# Patient Record
Sex: Male | Born: 1955 | Hispanic: No | Marital: Married | Smoking: Former smoker
Health system: Southern US, Community
[De-identification: ages and names within clinical notes are randomized; demographics above are authoritative.]

## PROBLEM LIST (undated history)

## (undated) DIAGNOSIS — E785 Hyperlipidemia, unspecified: Secondary | ICD-10-CM

## (undated) DIAGNOSIS — S46009A Unspecified injury of muscle(s) and tendon(s) of the rotator cuff of unspecified shoulder, initial encounter: Secondary | ICD-10-CM

## (undated) HISTORY — DX: Hyperlipidemia, unspecified: E78.5

## (undated) HISTORY — PX: OTHER SURGICAL HISTORY: SHX169

## (undated) HISTORY — PX: ROTATOR CUFF REPAIR: SHX139

## (undated) HISTORY — DX: Unspecified injury of muscle(s) and tendon(s) of the rotator cuff of unspecified shoulder, initial encounter: S46.009A

---

## 2008-02-20 HISTORY — PX: SHOULDER SURGERY: SHX246

## 2011-05-08 ENCOUNTER — Ambulatory Visit (INDEPENDENT_AMBULATORY_CARE_PROVIDER_SITE_OTHER): Payer: BC Managed Care – PPO | Admitting: Family Medicine

## 2011-05-08 VITALS — BP 115/76 | HR 61 | Temp 98.1°F | Resp 14 | Ht 72.75 in | Wt 195.4 lb

## 2011-05-08 DIAGNOSIS — Z0000 Encounter for general adult medical examination without abnormal findings: Secondary | ICD-10-CM

## 2011-05-08 LAB — POCT CBC
Granulocyte percent: 74.1 % (ref 37–80)
HCT, POC: 46.4 % (ref 43.5–53.7)
Hemoglobin: 15.6 g/dL (ref 14.1–18.1)
Lymph, poc: 1.4 (ref 0.6–3.4)
MCH, POC: 30.8 pg (ref 27–31.2)
MCHC: 33.6 g/dL (ref 31.8–35.4)
MCV: 91.7 fL (ref 80–97)
MID (cbc): 0.4 (ref 0–0.9)
MPV: 8.1 fL (ref 0–99.8)
POC Granulocyte: 5 (ref 2–6.9)
POC LYMPH PERCENT: 20.6 % (ref 10–50)
POC MID %: 5.3 % (ref 0–12)
Platelet Count, POC: 237 10*3/uL (ref 142–424)
RBC: 5.06 M/uL (ref 4.69–6.13)
RDW, POC: 13 %
WBC: 6.5 10*3/uL (ref 4.6–10.2)

## 2011-05-08 LAB — COMPREHENSIVE METABOLIC PANEL
CO2: 26 mEq/L (ref 19–32)
Calcium: 9.3 mg/dL (ref 8.4–10.5)
Chloride: 106 mEq/L (ref 96–112)
Creat: 0.83 mg/dL (ref 0.50–1.35)
Glucose, Bld: 98 mg/dL (ref 70–99)
Total Bilirubin: 0.4 mg/dL (ref 0.3–1.2)
Total Protein: 7.3 g/dL (ref 6.0–8.3)

## 2011-05-08 LAB — COMPREHENSIVE METABOLIC PANEL WITH GFR
ALT: 26 U/L (ref 0–53)
AST: 18 U/L (ref 0–37)
Albumin: 4.4 g/dL (ref 3.5–5.2)
Alkaline Phosphatase: 47 U/L (ref 39–117)
BUN: 17 mg/dL (ref 6–23)
Potassium: 4.2 meq/L (ref 3.5–5.3)
Sodium: 141 meq/L (ref 135–145)

## 2011-05-08 LAB — LIPID PANEL
Cholesterol: 240 mg/dL — ABNORMAL HIGH (ref 0–200)
HDL: 44 mg/dL (ref >39)
LDL Cholesterol: 161 mg/dL — ABNORMAL HIGH (ref 0–99)
Total CHOL/HDL Ratio: 5.5 Ratio
Triglycerides: 176 mg/dL — ABNORMAL HIGH (ref <150)
VLDL: 35 mg/dL (ref 0–40)

## 2011-05-08 LAB — IFOBT (OCCULT BLOOD): IFOBT: NEGATIVE

## 2011-05-08 NOTE — Progress Notes (Signed)
  [facility]:  Office Visit  Chief Complaint:  Chief Complaint  Patient presents with  . Annual Exam    HPI: Anthony Wolf is a 56 y.o. male who complains of  No new problems. Here for PE. Doing well this year.  Past Medical History  Diagnosis Date  . Rotator cuff injury    Past Surgical History  Procedure Date  . Rotator cuff repair    History   Social History  . Marital Status: Married    Spouse Name: N/A    Number of Children: N/A  . Years of Education: N/A   Social History Main Topics  . Smoking status: Former Games developer  . Smokeless tobacco: None  . Alcohol Use: No  . Drug Use: No  . Sexually Active: None   Other Topics Concern  . None   Social History Narrative  . None   Family History  Problem Relation Age of Onset  . Cancer Mother   . Stroke Father   . Asthma Maternal Grandmother    No Known Allergies Prior to Admission medications   Not on File     ROS: The patient denies fevers, chills, night sweats, unintentional weight loss, chest pain, palpitations, wheezing, dyspnea on exertion, nausea, vomiting, abdominal pain, dysuria, hematuria, melena, numbness, weakness, or tingling.   All other systems have been reviewed and were otherwise negative with the exception of those mentioned in the HPI and as above.    PHYSICAL EXAM: Filed Vitals:   05/08/11 0856  BP: 115/76  Pulse: 61  Temp: 98.1 F (36.7 C)  Resp: 14   Filed Vitals:   05/08/11 0856  Height: 6' 0.75" (1.848 m)  Weight: 195 lb 6.4 oz (88.633 kg)   Body mass index is 25.96 kg/(m^2).  General: Alert, no acute distress HEENT:  Normocephalic, atraumatic, oropharynx patent. Tmnl, EOMI, PERRLA, fundoscopic exam nl Cardiovascular:  Regular rate and rhythm, no rubs murmurs or gallops.  No Carotid bruits, radial pulse intact. No pedal edema.  Respiratory: Clear to auscultation bilaterally.  No wheezes, rales, or rhonchi.  No cyanosis, no use of accessory musculature GI: No  organomegaly, abdomen is soft and non-tender, positive bowel sounds.  No masses. Skin: No rashes. Neurologic: Facial musculature symmetric. Psychiatric: Patient is appropriate throughout our interaction. Lymphatic: No cervical lymphadenopathy Musculoskeletal: Gait intact. GU: no hernias, testes nl, prostate normal. - hemosure   LABS:    EKG/XRAY:   Primary read interpreted by Dr. Conley Rolls at [facility].   ASSESSMENT/PLAN: Encounter Diagnosis  Name Primary?  . Annual physical exam Yes   Patient declines colonoscopy. Doing well overall F/u in 1 year   Joana Nolton PHUONG, DO 05/11/2011 9:09 AM

## 2011-06-16 ENCOUNTER — Ambulatory Visit: Payer: BC Managed Care – PPO | Admitting: Family Medicine

## 2011-06-16 DIAGNOSIS — J309 Allergic rhinitis, unspecified: Secondary | ICD-10-CM

## 2011-06-16 DIAGNOSIS — H1045 Other chronic allergic conjunctivitis: Secondary | ICD-10-CM

## 2011-06-16 DIAGNOSIS — H1010 Acute atopic conjunctivitis, unspecified eye: Secondary | ICD-10-CM

## 2011-06-16 MED ORDER — FLUTICASONE PROPIONATE 50 MCG/ACT NA SUSP: 2.0000 | Freq: Every day | NASAL | Status: DC

## 2011-06-16 MED ORDER — PREDNISONE 20 MG PO TABS: 20.0000 mg | ORAL_TABLET | Freq: Every day | ORAL | Status: AC

## 2011-06-16 MED ORDER — OLOPATADINE HCL 0.1 % OP SOLN: 1.0000 [drp] | Freq: Two times a day (BID) | OPHTHALMIC | Status: AC

## 2011-06-16 MED ORDER — CETIRIZINE HCL 10 MG PO TABS: 10.0000 mg | ORAL_TABLET | Freq: Every day | ORAL | Status: DC

## 2011-06-16 NOTE — Progress Notes (Signed)
  Subjective:    Patient ID: Anthony Wolf, male    DOB: 10/19/1955, 56 y.o.   MRN: [id number]  HPI  Patient presents with complaints of allergies worsening over the last 3-4 days   Review of Systems  Constitutional: Positive for fatigue.  HENT: Positive for congestion and sneezing.   Eyes: Positive for itching.  Respiratory: Negative for cough, chest tightness and wheezing.        Objective:   Physical Exam  Constitutional: He appears well-developed.  HENT:  Right Ear: Tympanic membrane is retracted.  Left Ear: Tympanic membrane is retracted.  Nose: Mucosal edema present.  Eyes:    Neck: Neck supple.  Cardiovascular: Normal rate, regular rhythm and normal heart sounds.   Pulmonary/Chest: Effort normal and breath sounds normal.  Neurological: He is alert.  Skin: Skin is warm.          Assessment & Plan:   1. Allergic rhinitis  fluticasone (FLONASE) 50 MCG/ACT nasal spray, cetirizine (ZYRTEC) 10 MG tablet, predniSONE (DELTASONE) 20 MG tablet  2. Conjunctivitis, allergic  Trial [place] if symptoms persist fill Pantanol Rx

## 2011-09-10 ENCOUNTER — Other Ambulatory Visit: Payer: Self-pay | Admitting: Family Medicine

## 2011-09-10 ENCOUNTER — Encounter: Payer: Self-pay | Admitting: Family Medicine

## 2011-09-10 DIAGNOSIS — E785 Hyperlipidemia, unspecified: Secondary | ICD-10-CM

## 2011-09-10 MED ORDER — SIMVASTATIN 40 MG PO TABS: 40.0000 mg | ORAL_TABLET | Freq: Every day | ORAL | Status: DC

## 2014-11-10 ENCOUNTER — Ambulatory Visit (INDEPENDENT_AMBULATORY_CARE_PROVIDER_SITE_OTHER): Payer: BLUE CROSS/BLUE SHIELD | Admitting: Family Medicine

## 2014-11-10 VITALS — BP 130/78 | HR 54 | Temp 97.8°F | Resp 16 | Ht 71.0 in | Wt 198.8 lb

## 2014-11-10 DIAGNOSIS — Z130 Encounter for screening for diseases of the blood and blood-forming organs and certain disorders involving the immune mechanism: Secondary | ICD-10-CM

## 2014-11-10 DIAGNOSIS — L989 Disorder of the skin and subcutaneous tissue, unspecified: Secondary | ICD-10-CM | POA: Diagnosis not present

## 2014-11-10 DIAGNOSIS — Z1329 Encounter for screening for other suspected endocrine disorder: Secondary | ICD-10-CM

## 2014-11-10 DIAGNOSIS — Z125 Encounter for screening for malignant neoplasm of prostate: Secondary | ICD-10-CM

## 2014-11-10 DIAGNOSIS — Z23 Encounter for immunization: Secondary | ICD-10-CM | POA: Diagnosis not present

## 2014-11-10 DIAGNOSIS — J302 Other seasonal allergic rhinitis: Secondary | ICD-10-CM | POA: Diagnosis not present

## 2014-11-10 DIAGNOSIS — Z0000 Encounter for general adult medical examination without abnormal findings: Secondary | ICD-10-CM | POA: Diagnosis not present

## 2014-11-10 DIAGNOSIS — Z13220 Encounter for screening for lipoid disorders: Secondary | ICD-10-CM

## 2014-11-10 LAB — CBC
HCT: 42.4 % (ref 39.0–52.0)
Hemoglobin: 14.8 g/dL (ref 13.0–17.0)
MCH: 30.3 pg (ref 26.0–34.0)
MCHC: 34.9 g/dL (ref 30.0–36.0)
MCV: 86.9 fL (ref 78.0–100.0)
MPV: 9.4 fL (ref 8.6–12.4)
Platelets: 220 10*3/uL (ref 150–400)
RBC: 4.88 MIL/uL (ref 4.22–5.81)
RDW: 13.2 % (ref 11.5–15.5)
WBC: 3.7 10*3/uL — ABNORMAL LOW (ref 4.0–10.5)

## 2014-11-10 LAB — LIPID PANEL
Cholesterol: 215 mg/dL — ABNORMAL HIGH (ref 125–200)
HDL: 44 mg/dL (ref >=40)
LDL Cholesterol: 141 mg/dL — ABNORMAL HIGH (ref <130)
Total CHOL/HDL Ratio: 4.9 Ratio (ref <=5.0)
Triglycerides: 149 mg/dL (ref <150)
VLDL: 30 mg/dL (ref <30)

## 2014-11-10 LAB — COMPLETE METABOLIC PANEL WITH GFR
ALT: 20 U/L (ref 9–46)
AST: 17 U/L (ref 10–35)
Albumin: 4.4 g/dL (ref 3.6–5.1)
Alkaline Phosphatase: 48 U/L (ref 40–115)
BUN: 14 mg/dL (ref 7–25)
Calcium: 9 mg/dL (ref 8.6–10.3)
Chloride: 103 mmol/L (ref 98–110)
Creat: 0.96 mg/dL (ref 0.70–1.33)
GFR, Est African American: >89 mL/min (ref >=60)
Total Bilirubin: 0.8 mg/dL (ref 0.2–1.2)
Total Protein: 7.4 g/dL (ref 6.1–8.1)

## 2014-11-10 LAB — TSH: TSH: 1.822 u[IU]/mL (ref 0.350–4.500)

## 2014-11-10 LAB — COMPLETE METABOLIC PANEL WITHOUT GFR
CO2: 27 mmol/L (ref 20–31)
GFR, Est Non African American: 87 mL/min (ref >=60)
Glucose, Bld: 89 mg/dL (ref 65–99)
Potassium: 4.1 mmol/L (ref 3.5–5.3)
Sodium: 137 mmol/L (ref 135–146)

## 2014-11-10 NOTE — Progress Notes (Signed)
Chief Complaint:  Chief Complaint  Patient presents with  . Annual Exam    HPI: Anthony Wolf is a 59 y.o. male who reports to [facility] today for an annual exam  He has a skin lesion on  The peripenile area. It has been there for the last 1year, not really noticed if he has had changes to this.  No skin cancer in the family Otherwise he is doing well.  Declines flu vaccine He is utd on colonscopy which he had 2 years ago, needs to come back in 10 years, done at [place].  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 He has 2 children, 1 in college the other 1 in med school.  He also has crack along the sides of his mouth, he eats a healthy diet. He denies canker sores or fever blisters.    Past Medical History  Diagnosis Date  . Rotator cuff injury    Past Surgical History  Procedure Laterality Date  . Rotator cuff repair     Social History   Social History  . Marital Status: Married    Spouse Name: N/A  . Number of Children: N/A  . Years of Education: N/A   Social History Main Topics  . Smoking status: Former Games developer  . Smokeless tobacco: None  . Alcohol Use: No  . Drug Use: No  . Sexual Activity: Not Asked   Other Topics Concern  . None   Social History Narrative   Family History  Problem Relation Age of Onset  . Cancer Mother   . Stroke Father   . Asthma Maternal Grandmother    No Known Allergies Prior to Admission medications   Not on File     ROS: The patient denies fevers, chills, night sweats, unintentional weight loss, chest pain, palpitations, wheezing, dyspnea on exertion, nausea, vomiting, abdominal pain, dysuria, hematuria, melena, numbness, weakness, or tingling.   All other systems have been reviewed and were otherwise negative with the exception of those mentioned in the HPI and as above.    PHYSICAL EXAM: Filed Vitals:   11/10/14 0815  BP: 130/78  Pulse: 54  Temp: 97.8 F (36.6 C)  Resp: 16   Body mass  index is 27.74 kg/(m^2).   General: Alert, no acute distress HEENT:  Normocephalic, atraumatic, oropharynx patent. EOMI, PERRLA, fundo exam normal, Tm normal  Cardiovascular:  Regular rate and rhythm, no rubs murmurs or gallops.  No Carotid bruits, radial pulse intact. No pedal edema.  Respiratory: Clear to auscultation bilaterally.  No wheezes, rales, or rhonchi.  No cyanosis, no use of accessory musculature Abdominal: No organomegaly, abdomen is soft and non-tender, positive bowel sounds. No masses. Skin: + hyperpigmented lesion  peripenis area Neurologic: Facial musculature symmetric. Psychiatric: Patient acts appropriately throughout our interaction. Lymphatic: No cervical or submandibular lymphadenopathy Musculoskeletal: Gait intact. No edema, tenderness Prostate and testicular and hernia exam normal   LABS: Results for orders placed or performed in visit on 05/08/11  Comprehensive metabolic panel  Result Value Ref Range   Sodium 141 135 - 145 mEq/L   Potassium 4.2 3.5 - 5.3 mEq/L   Chloride 106 96 - 112 mEq/L   CO2 26 19 - 32 mEq/L   Glucose, Bld 98 70 - 99 mg/dL   BUN 17 6 - 23 mg/dL   Creat 2.53 6.64 - 4.03 mg/dL   Total Bilirubin 0.4 0.3 - 1.2 mg/dL   Alkaline Phosphatase 47 39 -  117 U/L   AST 18 0 - 37 U/L   ALT 26 0 - 53 U/L   Total Protein 7.3 6.0 - 8.3 g/dL   Albumin 4.4 3.5 - 5.2 g/dL   Calcium 9.3 8.4 - 96.0 mg/dL  Lipid panel  Result Value Ref Range   Cholesterol 240 (H) 0 - 200 mg/dL   Triglycerides 454 (H) <150 mg/dL   HDL 44 >09 mg/dL   Total CHOL/HDL Ratio 5.5 Ratio   VLDL 35 0 - 40 mg/dL   LDL Cholesterol 811 (H) 0 - 99 mg/dL  POCT CBC  Result Value Ref Range   WBC 6.5 4.6 - 10.2 K/uL   Lymph, poc 1.4 0.6 - 3.4   POC LYMPH PERCENT 20.6 10 - 50 %L   MID (cbc) 0.4 0 - 0.9   POC MID % 5.3 0 - 12 %M   POC Granulocyte 5.0 2 - 6.9   Granulocyte percent 74.1 37 - 80 %G   RBC 5.06 4.69 - 6.13 M/uL   Hemoglobin 15.6 14.1 - 18.1 g/dL   HCT, POC 91.4  78.2 - 53.7 %   MCV 91.7 80 - 97 fL   MCH, POC 30.8 27 - 31.2 pg   MCHC 33.6 31.8 - 35.4 g/dL   RDW, POC 95.6 %   Platelet Count, POC 237 142 - 424 K/uL   MPV 8.1 0 - 99.8 fL  IFOBT POC (occult bld, rslt in office)  Result Value Ref Range   IFOBT Negative      EKG/XRAY:   Primary read interpreted by [facility] Con[facility] Rolls at [facility].   ASSESSMENT/PLAN: Encounter Diagnoses  Name Primary?  . Annual physical exam Yes  . Seasonal allergies   . Screening for deficiency anemia   . Screening for hyperlipidemia   . Screening for thyroid disorder   . Screening for prostate cancer   . Skin lesion    Annual labs pending Refer to dermatology Tetanus given Decline flu I have asked medical records to get colonoscopy report from [facility]   Gross sideeffects, risk and benefits, and alternatives of medications d/w patient. Patient is aware that all medications have potential sideeffects and we are unable to predict every sideeffect or drug-drug interaction that may occur.  Thao Le DO  11/10/2014 10:33 AM

## 2014-11-11 LAB — PSA: PSA: 2.11 ng/mL (ref <=4.00)

## 2014-11-15 ENCOUNTER — Telehealth: Payer: Self-pay

## 2014-11-15 NOTE — Telephone Encounter (Signed)
Patient is returning a missed phone call for lab results. Please call on home phone! [phone]

## 2014-11-15 NOTE — Telephone Encounter (Signed)
Labs not reviewed yet unless Dr. Conley Rolls called.

## 2014-11-29 ENCOUNTER — Encounter: Payer: Self-pay | Admitting: Family Medicine

## 2014-11-30 ENCOUNTER — Encounter: Payer: Self-pay | Admitting: Family Medicine

## 2015-11-19 ENCOUNTER — Ambulatory Visit (INDEPENDENT_AMBULATORY_CARE_PROVIDER_SITE_OTHER): Payer: BLUE CROSS/BLUE SHIELD | Admitting: Family Medicine

## 2015-11-19 VITALS — BP 116/76 | HR 60 | Temp 97.9°F | Resp 17 | Ht 71.0 in | Wt 193.0 lb

## 2015-11-19 DIAGNOSIS — Z0000 Encounter for general adult medical examination without abnormal findings: Secondary | ICD-10-CM | POA: Diagnosis not present

## 2015-11-19 DIAGNOSIS — E785 Hyperlipidemia, unspecified: Secondary | ICD-10-CM

## 2015-11-19 LAB — HEPATITIS C ANTIBODY: HCV Ab: REACTIVE — AB

## 2015-11-19 LAB — CBC WITH DIFFERENTIAL/PLATELET
BASOS PCT: 0 %
Basophils Absolute: 0 cells/uL (ref 0–200)
Eosinophils Absolute: 78 cells/uL (ref 15–500)
Eosinophils Relative: 2 %
HEMATOCRIT: 41.1 % (ref 38.5–50.0)
Hemoglobin: 14.5 g/dL (ref 13.2–17.1)
LYMPHS ABS: 1248 {cells}/uL (ref 850–3900)
LYMPHS PCT: 32 %
MCH: 31 pg (ref 27.0–33.0)
MCHC: 35.3 g/dL (ref 32.0–36.0)
MCV: 88 fL (ref 80.0–100.0)
MONO ABS: 312 {cells}/uL (ref 200–950)
MPV: 9.2 fL (ref 7.5–12.5)
Monocytes Relative: 8 %
NEUTROS ABS: 2262 {cells}/uL (ref 1500–7800)
Neutrophils Relative %: 58 %
PLATELETS: 216 10*3/uL (ref 140–400)
RBC: 4.67 MIL/uL (ref 4.20–5.80)
RDW: 12.9 % (ref 11.0–15.0)
WBC: 3.9 10*3/uL (ref 3.8–10.8)

## 2015-11-19 LAB — LIPID PANEL
Cholesterol: 214 mg/dL — ABNORMAL HIGH (ref 125–200)
HDL: 33 mg/dL — ABNORMAL LOW (ref >=40)
LDL Cholesterol: 115 mg/dL (ref <130)
Total CHOL/HDL Ratio: 6.5 Ratio — ABNORMAL HIGH (ref <=5.0)
Triglycerides: 332 mg/dL — ABNORMAL HIGH (ref <150)
VLDL: 66 mg/dL — ABNORMAL HIGH (ref <30)

## 2015-11-19 LAB — COMPREHENSIVE METABOLIC PANEL
ALT: 15 U/L (ref 9–46)
AST: 19 U/L (ref 10–35)
Albumin: 4.4 g/dL (ref 3.6–5.1)
Alkaline Phosphatase: 36 U/L — ABNORMAL LOW (ref 40–115)
BILIRUBIN TOTAL: 0.4 mg/dL (ref 0.2–1.2)
BUN: 18 mg/dL (ref 7–25)
CALCIUM: 9.2 mg/dL (ref 8.6–10.3)
CO2: 25 mmol/L (ref 20–31)
Chloride: 104 mmol/L (ref 98–110)
Creat: 0.95 mg/dL (ref 0.70–1.33)
GLUCOSE: 94 mg/dL (ref 65–99)
Potassium: 4.4 mmol/L (ref 3.5–5.3)
Sodium: 138 mmol/L (ref 135–146)
Total Protein: 7.3 g/dL (ref 6.1–8.1)

## 2015-11-19 LAB — TSH: TSH: 2.37 m[IU]/L (ref 0.40–4.50)

## 2015-11-19 LAB — PSA: PSA: 1.4 ng/mL (ref <=4.0)

## 2015-11-19 NOTE — Patient Instructions (Addendum)
Great to meet you!  Your cholesterol was elevated last year (it may be elevated this year).  Consider cholesterol medications. For now we can pursue improving your diet and continuing exercise but we should probably repeat your labs in 4-6 months to see if you are getting the results you want.    Fat and Cholesterol Restricted Diet High levels of fat and cholesterol in your blood may lead to various health problems, such as diseases of the heart, blood vessels, gallbladder, liver, and pancreas. Fats are concentrated sources of energy that come in various forms. Certain types of fat, including saturated fat, may be harmful in excess. Cholesterol is a substance needed by your body in small amounts. Your body makes all the cholesterol it needs. Excess cholesterol comes from the food you eat. When you have high levels of cholesterol and saturated fat in your blood, health problems can develop because the excess fat and cholesterol will gather along the walls of your blood vessels, causing them to narrow. Choosing the right foods will help you control your intake of fat and cholesterol. This will help keep the levels of these substances in your blood within normal limits and reduce your risk of disease. WHAT TYPES OF FAT SHOULD I CHOOSE?  Choose healthy fats more often. Choose monounsaturated and polyunsaturated fats, such as olive and canola oil, flaxseeds, walnuts, almonds, and seeds.  Eat more omega-3 fats. Good choices include salmon, mackerel, sardines, tuna, flaxseed oil, and ground flaxseeds. Aim to eat fish at least two times a week.  Limit saturated fats. Saturated fats are primarily found in animal products, such as meats, butter, and cream. Plant sources of saturated fats include palm oil, palm kernel oil, and coconut oil.  Avoid foods with partially hydrogenated oils in them. These contain trans fats. Examples of foods that contain trans fats are stick margarine, some tub margarines, cookies,  crackers, and other baked goods. WHAT GENERAL GUIDELINES DO I NEED TO FOLLOW? These guidelines for healthy eating will help you control your intake of fat and cholesterol:  Check food labels carefully to identify foods with trans fats or high amounts of saturated fat.  Fill one half of your plate with vegetables and green salads.  Fill one fourth of your plate with whole grains. Look for the word "whole" as the first word in the ingredient list.  Fill one fourth of your plate with lean protein foods.  Limit fruit to two servings a day. Choose fruit instead of juice.  Eat more foods that contain soluble fiber. Examples of foods that contain this type of fiber are apples, broccoli, carrots, beans, peas, and barley. Aim to get 20-30 g of fiber per day.  Eat more home-cooked food and less restaurant, buffet, and fast food.  Limit or avoid alcohol.  Limit foods high in starch and sugar.  Limit fried foods.  Cook foods using methods other than frying. Baking, boiling, grilling, and broiling are all great options.  Lose weight if you are overweight. Losing just 5-10% of your initial body weight can help your overall health and prevent diseases such as diabetes and heart disease. WHAT FOODS CAN I EAT? Grains Whole grains, such as whole wheat or whole grain breads, crackers, cereals, and pasta. Unsweetened oatmeal, bulgur, barley, quinoa, or brown rice. Corn or whole wheat flour tortillas. Vegetables Fresh or frozen vegetables (raw, steamed, roasted, or grilled). Green salads. Fruits All fresh, canned (in natural juice), or frozen fruits. Meat and Other Protein Products Ground beef (  85% or leaner), grass-fed beef, or beef trimmed of fat. Skinless chicken or [place]. Ground chicken or [place]. Pork trimmed of fat. All fish and seafood. Eggs. Dried beans, peas, or lentils. Unsalted nuts or seeds. Unsalted canned or dry beans. Dairy Low-fat dairy products, such as skim or 1% milk, 2% or  reduced-fat cheeses, low-fat ricotta or cottage cheese, or plain low-fat yogurt. Fats and Oils Tub margarines without trans fats. Light or reduced-fat mayonnaise and salad dressings. Avocado. Olive, canola, sesame, or safflower oils. Natural peanut or almond butter (choose ones without added sugar and oil). The items listed above may not be a complete list of recommended foods or beverages. Contact your dietitian for more options. WHAT FOODS ARE NOT RECOMMENDED? Grains White bread. White pasta. White rice. Cornbread. Bagels, pastries, and croissants. Crackers that contain trans fat. Vegetables White potatoes. Corn. Creamed or fried vegetables. Vegetables in a cheese sauce. Fruits Dried fruits. Canned fruit in light or heavy syrup. Fruit juice. Meat and Other Protein Products Fatty cuts of meat. Ribs, chicken wings, bacon, sausage, bologna, salami, chitterlings, fatback, hot dogs, bratwurst, and packaged luncheon meats. Liver and organ meats. Dairy Whole or 2% milk, cream, half-and-half, and cream cheese. Whole milk cheeses. Whole-fat or sweetened yogurt. Full-fat cheeses. Nondairy creamers and whipped toppings. Processed cheese, cheese spreads, or cheese curds. Sweets and Desserts Corn syrup, sugars, honey, and molasses. Candy. Jam and jelly. Syrup. Sweetened cereals. Cookies, pies, cakes, donuts, muffins, and ice cream. Fats and Oils Butter, stick margarine, lard, shortening, ghee, or bacon fat. Coconut, palm kernel, or palm oils. Beverages Alcohol. Sweetened drinks (such as sodas, lemonade, and fruit drinks or punches). The items listed above may not be a complete list of foods and beverages to avoid. Contact your dietitian for more information.   This information is not intended to replace advice given to you by your health care provider. Make sure you discuss any questions you have with your health care provider.   Document Released: 02/05/2005 Document Revised: 02/26/2014 Document  Reviewed: 05/06/2013 Elsevier Interactive Patient Education 2016 [employer]     IF you received an x-ray today, you will receive an invoice from [facility]. Please contact [facility] at [phone] with questions or concerns regarding your invoice.   IF you received labwork today, you will receive an invoice from [facility]. Please contact [facility] at [phone] with questions or concerns regarding your invoice.   Our billing staff will not be able to assist you with questions regarding bills from these companies.  You will be contacted with the lab results as soon as they are available. The fastest way to get your results is to activate your My Chart account. Instructions are located on the last page of this paperwork. If you have not heard from us regarding the results in 2 weeks, please contact this office.

## 2015-11-19 NOTE — Progress Notes (Addendum)
   HPI  Patient presents today here for an annual physical exam.  Patient has no complaints or reports feeling like he is in very good health.  He exercises 2-3 times a week with walking or road biking. He does not limit his diet except for portion control. He would like lab work done today, he's fasting.  He declines a flu shot. He is up-to-date on colonoscopy  We discussed his elevated cholesterol, he would like to recheck labs, and try therapeutic lifestyle changes prior to trying medications if they are needed.  PMH: Smoking status noted Family history positive for stroke in his father, cancer in mother Denies tobacco, alcohol, and drug use. Has past medical history of allergies He is married and sexually active with only his wife. ROS: Per HPI  Objective: BP 116/76 (BP Location: Left Arm, Patient Position: Sitting, Cuff Size: Large)   Pulse 60   Temp 97.9 F (36.6 C) (Oral)   Resp 17   Ht 5\' 11"  (1.803 m)   Wt 193 lb (87.5 kg)   SpO2 99%   BMI 26.92 kg/m  Gen: NAD, alert, cooperative with exam HEENT: NCAT, EOMI, PERRL, nares clear, TMs normal bilaterally, oropharynx clear Neck: Supple, no tender lymphadenopathy CV: RRR, good S1/S2, no murmur Resp: CTABL, no wheezes, non-labored Abd: SNTND, BS present, no guarding or organomegaly Ext: No edema, warm Neuro: Alert and oriented, strength 5/5 and sensation intact in bilateral lower extremities  He declines prostate exam   Assessment and plan:  # Annual physical exam Normal exam Basic labs, fasting Hepatitis C screening also Offered influenza vaccine, he declines  # Hyperlipidemia 60 year old ASCVD risk score based on last years measurements are 7.9%/5.2% Discussed with patient, he would like to defer treatment if this is still elevated to give a trial of therapeutic lifestyle changes which were discussed.  His wife takes Crestor, if he needs a prescription that would likely be what he prefers.    Orders  Placed This Encounter  Procedures  . CBC with Differential  . Comprehensive metabolic panel  . Lipid panel  . PSA(Must document that pt has been informed of limitations of PSA testing.)  . TSH  . Hepatitis C antibody     Murtis SinkSam Bradshaw, MD  11/19/2015, 8:51 AM

## 2015-11-22 LAB — HEPATITIS C RNA QUANTITATIVE: HCV Quantitative: NOT DETECTED IU/mL (ref <15)

## 2015-11-23 ENCOUNTER — Encounter: Payer: Self-pay | Admitting: Family Medicine

## 2015-11-23 ENCOUNTER — Telehealth: Payer: Self-pay | Admitting: Family Medicine

## 2015-11-23 NOTE — Telephone Encounter (Signed)
No answer, Left VM that we will send letter, please call with any questions.   9.7/5.2% 10 year ASCVD risk.   Murtis SinkSam Bradshaw, MD 11/23/2015, 1:45 PM

## 2016-01-10 ENCOUNTER — Ambulatory Visit (INDEPENDENT_AMBULATORY_CARE_PROVIDER_SITE_OTHER): Payer: BLUE CROSS/BLUE SHIELD | Admitting: Family Medicine

## 2016-01-10 ENCOUNTER — Ambulatory Visit (INDEPENDENT_AMBULATORY_CARE_PROVIDER_SITE_OTHER): Payer: BLUE CROSS/BLUE SHIELD

## 2016-01-10 ENCOUNTER — Other Ambulatory Visit: Payer: Self-pay

## 2016-01-10 VITALS — BP 134/86 | HR 69 | Temp 98.1°F | Resp 16 | Ht 71.0 in | Wt 193.8 lb

## 2016-01-10 DIAGNOSIS — S93402A Sprain of unspecified ligament of left ankle, initial encounter: Secondary | ICD-10-CM

## 2016-01-10 MED ORDER — IBUPROFEN 800 MG PO TABS: 800.0000 mg | ORAL_TABLET | Freq: Three times a day (TID) | ORAL | 1 refills | Status: DC | PRN

## 2016-01-10 NOTE — Patient Instructions (Addendum)
IF you received an x-ray today, you will receive an invoice from [facility]. Please contact [facility] at [phone] with questions or concerns regarding your invoice.   IF you received labwork today, you will receive an invoice from [facility]. Please contact [facility] at [phone] with questions or concerns regarding your invoice.   Our billing staff will not be able to assist you with questions regarding bills from these companies.  You will be contacted with the lab results as soon as they are available. The fastest way to get your results is to activate your My Chart account. Instructions are located on the last page of this paperwork. If you have not heard from us regarding the results in 2 weeks, please contact this office.      Ankle Sprain, Phase II Rehab Ask your health care provider which exercises are safe for you. Do exercises exactly as told by your health care provider and adjust them as directed. It is normal to feel mild stretching, pulling, tightness, or discomfort as you do these exercises, but you should stop right away if you feel sudden pain or your pain gets worse.Do not begin these exercises until told by your health care provider. Stretching and range of motion exercises These exercises warm up your muscles and joints and improve the movement and flexibility of your lower leg and ankle. These exercises also help to relieve pain and stiffness. Exercise A: Gastroc stretch, standing 1. Stand with your hands against a wall. 2. Extend your left / right leg behind you, and bend your front knee slightly. Your heels should be on the floor. 3. Keeping your heels on the floor and your back knee straight, shift your weight toward the wall. You should feel a gentle stretch in the back of your lower leg (calf). 4. Hold this position for __________ seconds. Repeat __________ times. Complete this exercise __________ times a  day. Exercise B: Soleus stretch, standing 1. Stand with your hands against a wall. 2. Extend your left / right leg behind you, and bend your front knee slightly. Both of your heels should be on the floor. 3. Keeping your heels on the floor, bend your back knee and shift your weight slightly over your back leg. You should feel a gentle stretch deep in your calf. 4. Hold this position for __________ seconds. Repeat __________ times. Complete this exercise __________ times a day. Strengthening exercises These exercises build strength and endurance in your lower leg. Endurance is the ability to use your muscles for a long time, even after they get tired. Exercise C: Heel walking (dorsiflexion) Walk on your heels for __________ seconds or ___________ ft. Keep your toes as high as possible. Repeat __________ times. Complete this exercise __________ times a day. Balance exercises These exercises improve your balance and the reaction and control of your ankle to help improve stability. Exercise D: Multi-angle lunge 1. Stand with your feet together. 2. Take a step forward with your left / right leg, and shift your weight onto that leg. Your back heel will come off the floor, and your back toes will stay in place. 3. Push off your front leg to return your front foot to the starting position next to your other foot. 4. Repeat to the side, to the back, and any other directions as told by your health care provider. Repeat in each direction __________ times. Complete this exercise __________ times a day. Exercise E: Single leg stand 1. Without  shoes, stand near a railing or in a door frame. Hold onto the railing or door frame as needed. 2. Stand on your left / right foot. Keep your big toe down on the floor and try to keep your arch lifted. 3. Hold this position for __________ seconds. Repeat __________ times. Complete this exercise __________ times a day. If this exercise is too easy, you can try it with  your eyes closed or while standing on a pillow. Exercise F: Inversion/eversion You will need a balance board for this exercise. Ask your health care provider where you can get a balance board or how you can make one. 1. Stand on a non-carpeted surface near a countertop or wall. 2. Step onto the balance board so your feet are hip-width apart. 3. Keep your feet in place and keep your upper body and hips steady. Using only your feet and ankles to move the board, do one or both of the following exercises as told by your health care provider:  Tip the board side to side as far as you can, alternating between tipping to the left and tipping to the right. If you can, tip the board so it silently taps the floor. Do not let the board forcefully hit the floor. From time to time, pause to hold a steady position.  Tip the board side to side so the board does not hit the floor at all. From time to time, pause to hold a steady position. Repeat the movement for each exercise __________ times. Complete each exercise __________ times a day. Exercise G: Plantar flexion/dorsiflexion You will need a balance board for this exercise. Ask your health care provider where you can get a balance board or how you can make one. 1. Stand on a non-carpeted surface near a countertop or wall. 2. Step onto the balance board so your feet are hip-width apart. 3. Keep your feet in place and keep your upper body and hips steady. Using only your feet and ankles to move the board, do one or both of the following exercises as told by your health care provider:  Tip the board forward and backward so the board silently taps the floor. Do not let the board forcefully hit the floor. From time to time, pause to hold a steady position.  Tip the board forward and backward so the board does not hit the floor at all. From time to time, pause to hold a steady position. Repeat the movement for each exercise __________ times. Complete each exercise  __________ times a day. This information is not intended to replace advice given to you by your health care provider. Make sure you discuss any questions you have with your health care provider. Document Released: 05/28/2005 Document Revised: 10/13/2015 Document Reviewed: 12/20/2014 Elsevier Interactive Patient Education  2017 [employer]

## 2016-01-10 NOTE — Progress Notes (Signed)
  Chief Complaint  Patient presents with  . Ankle Pain    Injured today    HPI   Left Ankle Injury  Pt reports that he was working in his backyard and stepped into a ditch and twisted his ankle  He started to limp right away.  He reports that he immediately had swelling over the bone of the ankle on the outside of the foot (lateral malleolus) He reports that his pain is 5/10.   Past Medical History:  Diagnosis Date  . Rotator cuff injury     No current outpatient prescriptions on file.   No current facility-administered medications for this visit.     Allergies: No Known Allergies  Past Surgical History:  Procedure Laterality Date  . colonscopy     08/11/2012-internal hemorrhoids, repeat in 2024  . ROTATOR CUFF REPAIR      Social History   Social History  . Marital status: Married    Spouse name: N/A  . Number of children: N/A  . Years of education: N/A   Social History Main Topics  . Smoking status: Former Games developermoker  . Smokeless tobacco: None  . Alcohol use No  . Drug use: No  . Sexual activity: Not Asked   Other Topics Concern  . None   Social History Narrative  . None    Review of Systems  Constitutional: Negative for chills, fever and weight loss.  Eyes: Negative for blurred vision and double vision.  Musculoskeletal: Negative for back pain, falls and myalgias.  Neurological: Negative for dizziness and tingling.    Objective: Vitals:   01/10/16 1125  BP: 134/86  Pulse: 69  Resp: 16  Temp: 98.1 F (36.7 C)  TempSrc: Oral  SpO2: 97%  Weight: 193 lb 12.8 oz (87.9 kg)  Height: 5\' 11"  (1.803 m)    Physical Exam  Constitutional: He is oriented to person, place, and time. He appears well-developed and well-nourished.  HENT:  Head: Normocephalic and atraumatic.  Eyes: Conjunctivae and EOM are normal.  Pulmonary/Chest: Effort normal.  Musculoskeletal:       Left foot: There is decreased range of motion, tenderness, bony tenderness and  swelling. There is normal capillary refill, no crepitus, no deformity and no laceration.       Feet:  Neurological: He is alert and oriented to person, place, and time.  Skin: Capillary refill takes less than 2 seconds.  Psychiatric: He has a normal mood and affect. His behavior is normal. Judgment and thought content normal.    CLINICAL DATA:  Sprained left ankle.  EXAM: LEFT ANKLE COMPLETE - 3+ VIEW  COMPARISON:  None  FINDINGS: Moderate lateral soft tissue swelling identified. There is no underlying fracture or subluxation. No radio-opaque foreign bodies.  IMPRESSION: 1. Lateral soft tissue swelling compatible with the clinical history of sprain. 2. No acute bony abnormality identified.   Electronically Signed   By: Signa Kellaylor  Stroud M.D.   On: 01/10/2016 12:30     Assessment and Plan Devynn was seen today for ankle pain.  Diagnoses and all orders for this visit:  Sprain of left ankle, unspecified ligament, initial encounter -     DG Ankle Complete Left -  Will get xray to evaluate for fracture based on [place] ankle rules No fracture Therefore will treat for ankle sprain -  Discussed ice, elevation, and NSAIDs -  Will provide supportive care and brace   Lewayne Pauley A Creta LevinStallings

## 2016-11-23 ENCOUNTER — Encounter: Payer: Self-pay | Admitting: Urgent Care

## 2016-11-23 ENCOUNTER — Ambulatory Visit (INDEPENDENT_AMBULATORY_CARE_PROVIDER_SITE_OTHER): Payer: BLUE CROSS/BLUE SHIELD | Admitting: Urgent Care

## 2016-11-23 VITALS — BP 116/72 | HR 67 | Temp 98.6°F | Resp 17 | Ht 71.5 in | Wt 191.0 lb

## 2016-11-23 DIAGNOSIS — Z0000 Encounter for general adult medical examination without abnormal findings: Secondary | ICD-10-CM | POA: Diagnosis not present

## 2016-11-23 DIAGNOSIS — Z114 Encounter for screening for human immunodeficiency virus [HIV]: Secondary | ICD-10-CM

## 2016-11-23 DIAGNOSIS — N529 Male erectile dysfunction, unspecified: Secondary | ICD-10-CM

## 2016-11-23 MED ORDER — SILDENAFIL CITRATE 20 MG PO TABS: 20.0000 mg | ORAL_TABLET | Freq: Every day | ORAL | 0 refills | Status: DC | PRN

## 2016-11-23 NOTE — Progress Notes (Signed)
MRN: [id number]  Subjective:   [name] is a 61 y.o. male presenting for annual physical exam and erectile dysfucntion. Patient is married, does not have children. Works as an Personnel officer. Has good relationships at home, has a good support network. Denies smoking cigarettes or drinking alcohol.   Medical care team includes: PCP: None. Vision: Gets yearly eye exams. Wears eye glasses. Dental: Dental cleanings every 6 months. Has dentures.  Specialists: None.   ED - Reports that he has had several month history of difficulty obtaining and maintaining erections. He would like to try Viagra. Denies dysuria, hematuria, urinary frequency, penile discharge, penile swelling, testicular pain, testicular swelling, anal pain, groin pain. Denies history of HTN, HL, heart disease.  Anthony Wolf is not currently taking any medications and has No Known Allergies. Anthony Wolf  has a past medical history of Rotator cuff injury. Also  has a past surgical history that includes Rotator cuff repair and colonscopy. His family history includes Asthma in his maternal grandmother; Cancer in his mother; Stroke in his father.  Immunizations: Patient refuses immunizations.  Review of Systems  Constitutional: Negative for chills, diaphoresis, fever, malaise/fatigue and weight loss.  HENT: Negative for congestion, ear discharge, ear pain, hearing loss, nosebleeds, sore throat and tinnitus.   Eyes: Negative for blurred vision, double vision, photophobia, pain, discharge and redness.  Respiratory: Negative for cough, shortness of breath and wheezing.   Cardiovascular: Negative for chest pain, palpitations and leg swelling.  Gastrointestinal: Negative for abdominal pain, blood in stool, constipation, diarrhea, nausea and vomiting.  Genitourinary: Negative for dysuria, flank pain, frequency, hematuria and urgency.  Musculoskeletal: Negative for back pain, joint pain and myalgias.  Skin: Negative for itching and rash.    Neurological: Negative for dizziness, tingling, seizures, loss of consciousness, weakness and headaches.  Endo/Heme/Allergies: Negative for polydipsia.  Psychiatric/Behavioral: Negative for depression, hallucinations, memory loss, substance abuse and suicidal ideas. The patient is not nervous/anxious and does not have insomnia.     Objective:   Vitals: BP 116/72   Pulse 67   Temp 98.6 F (37 C) (Oral)   Resp 17   Ht 5' 11.5" (1.816 m)   Wt 191 lb (86.6 kg)   SpO2 98%   BMI 26.27 kg/m   Physical Exam  Constitutional: He is oriented to person, place, and time. He appears well-developed and well-nourished.  HENT:  TM's intact bilaterally, no effusions or erythema. Nasal turbinates pink and moist, nasal passages patent. No sinus tenderness. Oropharynx clear, mucous membranes moist, dentition in good repair.  Eyes: Pupils are equal, round, and reactive to light. Conjunctivae and EOM are normal. Right eye exhibits no discharge. Left eye exhibits no discharge. No scleral icterus.  Neck: Normal range of motion. Neck supple. No thyromegaly present.  Cardiovascular: Normal rate, regular rhythm and intact distal pulses.  Exam reveals no gallop and no friction rub.   No murmur heard. Pulmonary/Chest: No stridor. No respiratory distress. He has no wheezes. He has no rales.  Abdominal: Soft. Bowel sounds are normal. He exhibits no distension and no mass. There is no tenderness.  Musculoskeletal: Normal range of motion. He exhibits no edema or tenderness.  Lymphadenopathy:    He has no cervical adenopathy.  Neurological: He is alert and oriented to person, place, and time. He has normal reflexes. He displays normal reflexes. Coordination normal.  Skin: Skin is warm and dry. No rash noted. No erythema. No pallor.  Psychiatric: He has a normal mood and affect.  Assessment and Plan :   1. Annual physical exam - Medically healthy and pleasant patient. Discussed healthy lifestyle, diet,  exercise, preventative care, vaccinations, and addressed patient's concerns.  - TSH - Lipid panel - Comprehensive metabolic panel - CBC  2. Screening for HIV (human immunodeficiency virus) - HIV antibody  3. Erectile dysfunction, unspecified erectile dysfunction type - Will start trial of sildenafil. Counseled patient on potential for adverse effects with medications prescribed today, patient verbalized understanding. Follow up in ~2 weeks. - sildenafil (REVATIO) 20 MG tablet; Take 1-2 tablets (20-40 mg total) by mouth daily as needed.  Dispense: 30 tablet; Refill: 0   Wallis Bamberg, PA-C [facility] [phone] 11/23/2016  8:31 AM

## 2016-11-23 NOTE — Patient Instructions (Addendum)
Health Maintenance, Male A healthy lifestyle and preventive care is important for your health and wellness. Ask your health care provider about what schedule of regular examinations is right for you. What should I know about weight and diet? Eat a Healthy Diet  Eat plenty of vegetables, fruits, whole grains, low-fat dairy products, and lean protein.  Do not eat a lot of foods high in solid fats, added sugars, or salt.  Maintain a Healthy Weight Regular exercise can help you achieve or maintain a healthy weight. You should:  Do at least 150 minutes of exercise each week. The exercise should increase your heart rate and make you sweat (moderate-intensity exercise).  Do strength-training exercises at least twice a week.  Watch Your Levels of Cholesterol and Blood Lipids  Have your blood tested for lipids and cholesterol every 5 years starting at 61 years of age. If you are at high risk for heart disease, you should start having your blood tested when you are 61 years old. You may need to have your cholesterol levels checked more often if: ? Your lipid or cholesterol levels are high. ? You are older than 61 years of age. ? You are at high risk for heart disease.  What should I know about cancer screening? Many types of cancers can be detected early and may often be prevented. Lung Cancer  You should be screened every year for lung cancer if: ? You are a current smoker who has smoked for at least 30 years. ? You are a former smoker who has quit within the past 15 years.  Talk to your health care provider about your screening options, when you should start screening, and how often you should be screened.  Colorectal Cancer  Routine colorectal cancer screening usually begins at 61 years of age and should be repeated every 5-10 years until you are 61 years old. You may need to be screened more often if early forms of precancerous polyps or small growths are found. Your health care provider  may recommend screening at an earlier age if you have risk factors for colon cancer.  Your health care provider may recommend using home test kits to check for hidden blood in the stool.  A small camera at the end of a tube can be used to examine your colon (sigmoidoscopy or colonoscopy). This checks for the earliest forms of colorectal cancer.  Prostate and Testicular Cancer  Depending on your age and overall health, your health care provider may do certain tests to screen for prostate and testicular cancer.  Talk to your health care provider about any symptoms or concerns you have about testicular or prostate cancer.  Skin Cancer  Check your skin from head to toe regularly.  Tell your health care provider about any new moles or changes in moles, especially if: ? There is a change in a mole's size, shape, or color. ? You have a mole that is larger than a pencil eraser.  Always use sunscreen. Apply sunscreen liberally and repeat throughout the day.  Protect yourself by wearing long sleeves, pants, a wide-brimmed hat, and sunglasses when outside.  What should I know about heart disease, diabetes, and high blood pressure?  If you are 18-39 years of age, have your blood pressure checked every 3-5 years. If you are 40 years of age or older, have your blood pressure checked every year. You should have your blood pressure measured twice-once when you are at a hospital or clinic, and once when   you are not at a hospital or clinic. Record the average of the two measurements. To check your blood pressure when you are not at a hospital or clinic, you can use: ? An automated blood pressure machine at a pharmacy. ? A home blood pressure monitor.  Talk to your health care provider about your target blood pressure.  If you are between 24-40 years old, ask your health care provider if you should take aspirin to prevent heart disease.  Have regular diabetes screenings by checking your fasting blood  sugar level. ? If you are at a normal weight and have a low risk for diabetes, have this test once every three years after the age of 52. ? If you are overweight and have a high risk for diabetes, consider being tested at a younger age or more often.  A one-time screening for abdominal aortic aneurysm (AAA) by ultrasound is recommended for men aged 65-75 years who are current or former smokers. What should I know about preventing infection? Hepatitis B If you have a higher risk for hepatitis B, you should be screened for this virus. Talk with your health care provider to find out if you are at risk for hepatitis B infection. Hepatitis C Blood testing is recommended for:  Everyone born from [birth year] through 1965.  Anyone with known risk factors for hepatitis C.  Sexually Transmitted Diseases (STDs)  You should be screened each year for STDs including gonorrhea and chlamydia if: ? You are sexually active and are younger than 61 years of age. ? You are older than 61 years of age and your health care provider tells you that you are at risk for this type of infection. ? Your sexual activity has changed since you were last screened and you are at an increased risk for chlamydia or gonorrhea. Ask your health care provider if you are at risk.  Talk with your health care provider about whether you are at high risk of being infected with HIV. Your health care provider may recommend a prescription medicine to help prevent HIV infection.  What else can I do?  Schedule regular health, dental, and eye exams.  Stay current with your vaccines (immunizations).  Do not use any tobacco products, such as cigarettes, chewing tobacco, and e-cigarettes. If you need help quitting, ask your health care provider.  Limit alcohol intake to no more than 2 drinks per day. One drink equals 12 ounces of beer, 5 ounces of wine, or 1 ounces of hard liquor.  Do not use street drugs.  Do not share needles.  Ask your  health care provider for help if you need support or information about quitting drugs.  Tell your health care provider if you often feel depressed.  Tell your health care provider if you have ever been abused or do not feel safe at home. This information is not intended to replace advice given to you by your health care provider. Make sure you discuss any questions you have with your health care provider. Document Released: 08/04/2007 Document Revised: 10/05/2015 Document Reviewed: 11/09/2014 Elsevier Interactive Patient Education  2018 [employer].    Sildenafil tablets (Revatio) What is this medicine? SILDENAFIL (sil DEN a fil) is used to treat pulmonary arterial hypertension. This is a serious heart and lung condition. This medicine helps to improve symptoms and quality of life. This medicine may be used for other purposes; ask your health care provider or pharmacist if you have questions. COMMON BRAND NAME(S): Revatio What should  I tell my health care provider before I take this medicine? They need to know if you have any of these conditions: -anatomical deformation of the penis, Peyronie's disease, or history of priapism (painful and prolonged erection) -bleeding disorders -eye disease, vision problems -heart disease -high or low blood pressure -history of blood diseases, like sickle cell anemia or leukemia -kidney disease -liver disease -pulmonary veno-occlusive disease (PVOD) -stomach ulcer -an unusual or allergic reaction to sildenafil, other medicines, foods, dyes, or preservatives -pregnant or trying to get pregnant -breast-feeding How should I use this medicine? Take this medicine by mouth with a glass of water. Follow the directions on the prescription label. You can take it with or without food. If it upsets your stomach, take it with food. Take your doses at regular intervals about 4 to 6 hours apart. Do not take it more often than directed. Do not stop taking except  on your doctor's advice. Talk to your pediatrician regarding the use of this medicine in children. This medicine is not approved for use in children. Overdosage: If you think you have taken too much of this medicine contact a poison control center or emergency room at once. NOTE: This medicine is only for you. Do not share this medicine with others. What if I miss a dose? If you miss a dose, take it as soon as you can. If it is almost time for your next dose, take only that dose. Do not take double or extra doses. What may interact with this medicine? Do not take this medicine with any of the following medications: -cisapride -cobicistat -nitrates like amyl nitrite, isosorbide dinitrate, isosorbide mononitrate, nitroglycerin -riociguat -telaprevir This medicine may also interact with the following medications: -antiviral medicines for HIV or AIDS -bosentan -certain medicines for benign prostatic hyperplasia (BPH) -certain medicines for blood pressure -certain medicines for fungal infections like ketoconazole and itraconazole -cimetidine -erythromycin -rifampin This list may not describe all possible interactions. Give your health care provider a list of all the medicines, herbs, non-prescription drugs, or dietary supplements you use. Also tell them if you smoke, drink alcohol, or use illegal drugs. Some items may interact with your medicine. What should I watch for while using this medicine? Tell your doctor or healthcare professional if your symptoms do not start to get better or if they get worse. Tell your doctor or health care professional right away if you have any change in your eyesight or hearing. You may get dizzy. Do not drive, use machinery, or do anything that needs mental alertness until you know how this medicine affects you. Do not stand or sit up quickly, especially if you are an older patient. This reduces the risk of dizzy or fainting spells. Avoid alcoholic drinks; they  can make you more dizzy. What side effects may I notice from receiving this medicine? Side effects that you should report to your doctor or health care professional as soon as possible: -allergic reactions like skin rash, itching or hives, swelling of the face, lips, or tongue -breathing problems -changes in vision -chest pain -decreased hearing -fast, irregular heartbeat -men: prolonged or painful erection (lasting more than 4 hours) Side effects that usually do not require medical attention (report to your doctor or health care professional if they continue or are bothersome): -facial flushing -headache -nosebleed -trouble sleeping -upset stomach This list may not describe all possible side effects. Call your doctor for medical advice about side effects. You may report side effects to FDA at 1-800-FDA-1088. Where should  I keep my medicine? Keep out of reach of children. Store at room temperature between 15 and 30 degrees C (59 and 86 degrees F). Throw away any unused medicine after the expiration date. NOTE: This sheet is a summary. It may not cover all possible information. If you have questions about this medicine, talk to your doctor, pharmacist, or health care provider.  2018 Elsevier/Gold Standard (2015-01-19 17:18:06)     IF you received an x-ray today, you will receive an invoice from [facility]. Please contact [facility] at [phone] with questions or concerns regarding your invoice.   IF you received labwork today, you will receive an invoice from [place]. Please contact [facility] at [phone] with questions or concerns regarding your invoice.   Our billing staff will not be able to assist you with questions regarding bills from these companies.  You will be contacted with the lab results as soon as they are available. The fastest way to get your results is to activate your My Chart account. Instructions are located on the last page of this  paperwork. If you have not heard from [place] regarding the results in 2 weeks, please contact this office.

## 2016-11-24 LAB — CBC
HEMOGLOBIN: 15 g/dL (ref 13.0–17.7)
Hematocrit: 43.8 % (ref 37.5–51.0)
MCH: 31 pg (ref 26.6–33.0)
MCHC: 34.2 g/dL (ref 31.5–35.7)
MCV: 91 fL (ref 79–97)
Platelets: 217 10*3/uL (ref 150–379)
RBC: 4.84 x10E6/uL (ref 4.14–5.80)
RDW: 12.9 % (ref 12.3–15.4)
WBC: 5.3 10*3/uL (ref 3.4–10.8)

## 2016-11-24 LAB — COMPREHENSIVE METABOLIC PANEL
ALBUMIN: 4.5 g/dL (ref 3.6–4.8)
ALT: 22 IU/L (ref 0–44)
AST: 20 IU/L (ref 0–40)
Albumin/Globulin Ratio: 1.6 (ref 1.2–2.2)
Alkaline Phosphatase: 45 IU/L (ref 39–117)
BUN / CREAT RATIO: 23 (ref 10–24)
BUN: 23 mg/dL (ref 8–27)
Bilirubin Total: 0.4 mg/dL (ref 0.0–1.2)
CHLORIDE: 105 mmol/L (ref 96–106)
CO2: 23 mmol/L (ref 20–29)
CREATININE: 0.98 mg/dL (ref 0.76–1.27)
Calcium: 9.2 mg/dL (ref 8.6–10.2)
GFR calc non Af Amer: 83 mL/min/{1.73_m2} (ref >59)
GFR, EST AFRICAN AMERICAN: 96 mL/min/{1.73_m2} (ref >59)
GLUCOSE: 96 mg/dL (ref 65–99)
Globulin, Total: 2.8 g/dL (ref 1.5–4.5)
Potassium: 4.2 mmol/L (ref 3.5–5.2)
Sodium: 140 mmol/L (ref 134–144)
TOTAL PROTEIN: 7.3 g/dL (ref 6.0–8.5)

## 2016-11-24 LAB — LIPID PANEL
CHOL/HDL RATIO: 5.4 ratio — AB (ref 0.0–5.0)
Cholesterol, Total: 204 mg/dL — ABNORMAL HIGH (ref 100–199)
HDL: 38 mg/dL — ABNORMAL LOW (ref >39)
LDL Calculated: 115 mg/dL — ABNORMAL HIGH (ref 0–99)
Triglycerides: 254 mg/dL — ABNORMAL HIGH (ref 0–149)
VLDL Cholesterol Cal: 51 mg/dL — ABNORMAL HIGH (ref 5–40)

## 2016-11-24 LAB — TSH: TSH: 2.14 u[IU]/mL (ref 0.450–4.500)

## 2016-11-24 LAB — HIV ANTIBODY (ROUTINE TESTING W REFLEX): HIV SCREEN 4TH GENERATION: NONREACTIVE

## 2018-01-06 ENCOUNTER — Encounter: Payer: BLUE CROSS/BLUE SHIELD | Admitting: Family Medicine

## 2018-02-21 ENCOUNTER — Encounter: Payer: Self-pay | Admitting: Family Medicine

## 2018-02-21 ENCOUNTER — Other Ambulatory Visit: Payer: Self-pay

## 2018-02-21 ENCOUNTER — Ambulatory Visit (INDEPENDENT_AMBULATORY_CARE_PROVIDER_SITE_OTHER): Payer: BLUE CROSS/BLUE SHIELD | Admitting: Family Medicine

## 2018-02-21 VITALS — BP 135/83 | HR 65 | Temp 97.5°F | Resp 16 | Ht 70.0 in | Wt 199.8 lb

## 2018-02-21 DIAGNOSIS — Z125 Encounter for screening for malignant neoplasm of prostate: Secondary | ICD-10-CM

## 2018-02-21 DIAGNOSIS — Z0001 Encounter for general adult medical examination with abnormal findings: Secondary | ICD-10-CM | POA: Diagnosis not present

## 2018-02-21 DIAGNOSIS — E785 Hyperlipidemia, unspecified: Secondary | ICD-10-CM | POA: Diagnosis not present

## 2018-02-21 DIAGNOSIS — Z0000 Encounter for general adult medical examination without abnormal findings: Secondary | ICD-10-CM

## 2018-02-21 DIAGNOSIS — Z130 Encounter for screening for diseases of the blood and blood-forming organs and certain disorders involving the immune mechanism: Secondary | ICD-10-CM | POA: Diagnosis not present

## 2018-02-21 NOTE — Progress Notes (Signed)
1/3/20209:54 AM  Anthony Wolf 1956-02-10, 63 y.o. male [id number]  Chief Complaint  Patient presents with  . Annual Exam    not having any issus at this time    HPI:   Patient is a 63 y.o. male with past medical history significant for HLP and ED who presents today for CPE  Last CPE Oct 2018  Colorectal Cancer Screening: 2014 Prostate Cancer Screening: 2017 HIV Screening: 2018 Seasonal Influenza Vaccination: has had this season at [employer] Td/Tdap Vaccination: 2016 Pneumococcal Vaccination: at age 10 Zoster Vaccination: at pharmacy Frequency of Dental evaluation: most work done at [place], has partials Frequency of Eye evaluation: yearly, [facility], wears glasses  Lab Results  Component Value Date   [place] 115 (H) 11/23/2016     Fall Risk  02/21/2018 11/23/2016 01/10/2016 11/19/2015  Falls in the past year? 0 No No No     Depression screen [facility] 2/9 02/21/2018 11/23/2016 01/10/2016  Decreased Interest 0 0 0  Down, Depressed, Hopeless 0 0 0  PHQ - 2 Score 0 0 0    No Known Allergies  Prior to Admission medications   Medication Sig Start Date End Date Taking? Authorizing Provider  sildenafil (REVATIO) 20 MG tablet Take 1-2 tablets (20-40 mg total) by mouth daily as needed. 11/23/16   Jaynee Eagles, PA-C    Past Medical History:  Diagnosis Date  . Rotator cuff injury     Past Surgical History:  Procedure Laterality Date  . colonscopy     08/11/2012-internal hemorrhoids, repeat in 2024  . ROTATOR CUFF REPAIR      Social History   Tobacco Use  . Smoking status: Former Research scientist (life sciences)  . Smokeless tobacco: Never Used  Substance Use Topics  . Alcohol use: No    Family History  Problem Relation Age of Onset  . Cancer Mother   . Stroke Father   . Asthma Maternal Grandmother     Review of Systems  Constitutional: Negative for chills and fever.  Respiratory: Negative for cough and shortness of breath.   Cardiovascular: Negative for chest pain, palpitations and  leg swelling.  Gastrointestinal: Negative for abdominal pain, nausea and vomiting.  All other systems reviewed and are negative.    OBJECTIVE:  Blood pressure 135/83, pulse 65, temperature (!) 97.5 F (36.4 C), temperature source Oral, resp. rate 16, height 5' 10"  (1.778 m), weight 199 lb 12.8 oz (90.6 kg), SpO2 97 %. Body mass index is 28.67 kg/m.    Visual Acuity Screening   Right eye Left eye Both eyes  Without correction:     With correction: 13 13 13     Physical Exam Vitals signs and nursing note reviewed.  Constitutional:      Appearance: He is well-developed.  HENT:     Head: Normocephalic and atraumatic.     Right Ear: Hearing, tympanic membrane, ear canal and external ear normal.     Left Ear: Hearing, tympanic membrane, ear canal and external ear normal.     Mouth/Throat:     Pharynx: No oropharyngeal exudate.  Eyes:     Conjunctiva/sclera: Conjunctivae normal.     Pupils: Pupils are equal, round, and reactive to light.  Neck:     Musculoskeletal: Neck supple.     Thyroid: No thyromegaly.  Cardiovascular:     Rate and Rhythm: Normal rate and regular rhythm.     Heart sounds: Normal heart sounds. No murmur. No friction rub. No gallop.   Pulmonary:     Effort:  Pulmonary effort is normal.     Breath sounds: Normal breath sounds. No wheezing, rhonchi or rales.  Abdominal:     General: Bowel sounds are normal. There is no distension.     Palpations: Abdomen is soft. There is no mass.     Tenderness: There is no abdominal tenderness.  Musculoskeletal: Normal range of motion.  Lymphadenopathy:     Cervical: No cervical adenopathy.  Skin:    General: Skin is warm and dry.  Neurological:     Mental Status: He is alert and oriented to person, place, and time.     Cranial Nerves: No cranial nerve deficit.     Coordination: Coordination normal.     Gait: Gait normal.     Deep Tendon Reflexes: Reflexes are normal and symmetric.     ASSESSMENT and PLAN  1.  Annual physical exam No concerns per history or exam. Routine HCM labs ordered. HCM reviewed/discussed. Anticipatory guidance regarding healthy weight, lifestyle and choices given.   2. Hyperlipidemia, unspecified hyperlipidemia type - Lipid panel - CMP14+EGFR - TSH  3. Screening for prostate cancer - PSA  4. Screening for deficiency anemia - CBC   Return in about 1 year (around 02/22/2019) for CPE.    Rutherford Guys, MD [facility], [place] [address] Ph.  [phone] Fax [phone]

## 2018-02-21 NOTE — Patient Instructions (Addendum)
Shingrix at pharmacy of choice   Preventive Care 40-64 Years, Male Preventive care refers to lifestyle choices and visits with your health care provider that can promote health and wellness. What does preventive care include?   A yearly physical exam. This is also called an annual well check.  Dental exams once or twice a year.  Routine eye exams. Ask your health care provider how often you should have your eyes checked.  Personal lifestyle choices, including: ? Daily care of your teeth and gums. ? Regular physical activity. ? Eating a healthy diet. ? Avoiding tobacco and drug use. ? Limiting alcohol use. ? Practicing safe sex. ? Taking low-dose aspirin every day starting at age 71. What happens during an annual well check? The services and screenings done by your health care provider during your annual well check will depend on your age, overall health, lifestyle risk factors, and family history of disease. Counseling Your health care provider may ask you questions about your:  Alcohol use.  Tobacco use.  Drug use.  Emotional well-being.  Home and relationship well-being.  Sexual activity.  Eating habits.  Work and work Statistician. Screening You may have the following tests or measurements:  Height, weight, and BMI.  Blood pressure.  Lipid and cholesterol levels. These may be checked every 5 years, or more frequently if you are over 68 years old.  Skin check.  Lung cancer screening. You may have this screening every year starting at age 78 if you have a 30-pack-year history of smoking and currently smoke or have quit within the past 15 years.  Colorectal cancer screening. All adults should have this screening starting at age 36 and continuing until age 39. Your health care provider may recommend screening at age 30. You will have tests every 1-10 years, depending on your results and the type of screening test. People at increased risk should start  screening at an earlier age. Screening tests may include: ? Guaiac-based fecal occult blood testing. ? Fecal immunochemical test (FIT). ? Stool DNA test. ? Virtual colonoscopy. ? Sigmoidoscopy. During this test, a flexible tube with a tiny camera (sigmoidoscope) is used to examine your rectum and lower colon. The sigmoidoscope is inserted through your anus into your rectum and lower colon. ? Colonoscopy. During this test, a long, thin, flexible tube with a tiny camera (colonoscope) is used to examine your entire colon and rectum.  Prostate cancer screening. Recommendations will vary depending on your family history and other risks.  Hepatitis C blood test.  Hepatitis B blood test.  Sexually transmitted disease (STD) testing.  Diabetes screening. This is done by checking your blood sugar (glucose) after you have not eaten for a while (fasting). You may have this done every 1-3 years. Discuss your test results, treatment options, and if necessary, the need for more tests with your health care provider. Vaccines Your health care provider may recommend certain vaccines, such as:  Influenza vaccine. This is recommended every year.  Tetanus, diphtheria, and acellular pertussis (Tdap, Td) vaccine. You may need a Td booster every 10 years.  Varicella vaccine. You may need this if you have not been vaccinated.  Zoster vaccine. You may need this after age 16.  Measles, mumps, and rubella (MMR) vaccine. You may need at least one dose of MMR if you were born in 1957 or later. You may also need a second dose.  Pneumococcal 13-valent conjugate (PCV13) vaccine. You may need this if you have certain conditions and have  not been vaccinated.  Pneumococcal polysaccharide (PPSV23) vaccine. You may need one or two doses if you smoke cigarettes or if you have certain conditions.  Meningococcal vaccine. You may need this if you have certain conditions.  Hepatitis A vaccine. You may need this if you  have certain conditions or if you travel or work in places where you may be exposed to hepatitis A.  Hepatitis B vaccine. You may need this if you have certain conditions or if you travel or work in places where you may be exposed to hepatitis B.  Haemophilus influenzae type b (Hib) vaccine. You may need this if you have certain risk factors. Talk to your health care provider about which screenings and vaccines you need and how often you need them. This information is not intended to replace advice given to you by your health care provider. Make sure you discuss any questions you have with your health care provider. Document Released: 03/04/2015 Document Revised: 03/28/2017 Document Reviewed: 12/07/2014 Elsevier Interactive Patient Education  [employer].   If you have lab work done today you will be contacted with your lab results within the next 2 weeks.  If you have not heard from [place] then please contact us. The fastest way to get your results is to register for My Chart.   IF you received an x-ray today, you will receive an invoice from [facility]. Please contact [facility] at [phone] with questions or concerns regarding your invoice.   IF you received labwork today, you will receive an invoice from [place]. Please contact [facility] at [phone] with questions or concerns regarding your invoice.   Our billing staff will not be able to assist you with questions regarding bills from these companies.  You will be contacted with the lab results as soon as they are available. The fastest way to get your results is to activate your My Chart account. Instructions are located on the last page of this paperwork. If you have not heard from [place] regarding the results in 2 weeks, please contact this office.

## 2018-02-22 LAB — LIPID PANEL
Chol/HDL Ratio: 5.9 ratio — ABNORMAL HIGH (ref 0.0–5.0)
Cholesterol, Total: 235 mg/dL — ABNORMAL HIGH (ref 100–199)
HDL: 40 mg/dL (ref >39)
LDL Calculated: 150 mg/dL — ABNORMAL HIGH (ref 0–99)
Triglycerides: 224 mg/dL — ABNORMAL HIGH (ref 0–149)
VLDL Cholesterol Cal: 45 mg/dL — ABNORMAL HIGH (ref 5–40)

## 2018-02-22 LAB — CBC
Hematocrit: 44 % (ref 37.5–51.0)
Hemoglobin: 15.2 g/dL (ref 13.0–17.7)
MCH: 30.6 pg (ref 26.6–33.0)
MCHC: 34.5 g/dL (ref 31.5–35.7)
MCV: 89 fL (ref 79–97)
Platelets: 229 10*3/uL (ref 150–450)
RBC: 4.97 x10E6/uL (ref 4.14–5.80)
RDW: 12.5 % (ref 12.3–15.4)
WBC: 5.2 10*3/uL (ref 3.4–10.8)

## 2018-02-22 LAB — CMP14+EGFR
ALT: 29 IU/L (ref 0–44)
AST: 22 IU/L (ref 0–40)
Albumin/Globulin Ratio: 1.5 (ref 1.2–2.2)
Albumin: 4.3 g/dL (ref 3.6–4.8)
Alkaline Phosphatase: 44 IU/L (ref 39–117)
BUN/Creatinine Ratio: 26 — ABNORMAL HIGH (ref 10–24)
BUN: 22 mg/dL (ref 8–27)
Bilirubin Total: 0.5 mg/dL (ref 0.0–1.2)
CO2: 20 mmol/L (ref 20–29)
Calcium: 8.9 mg/dL (ref 8.6–10.2)
Chloride: 105 mmol/L (ref 96–106)
Creatinine, Ser: 0.85 mg/dL (ref 0.76–1.27)
GFR calc Af Amer: 108 mL/min/{1.73_m2} (ref >59)
GFR calc non Af Amer: 93 mL/min/{1.73_m2} (ref >59)
Globulin, Total: 2.9 g/dL (ref 1.5–4.5)
Glucose: 93 mg/dL (ref 65–99)
Potassium: 4.1 mmol/L (ref 3.5–5.2)
Sodium: 140 mmol/L (ref 134–144)
Total Protein: 7.2 g/dL (ref 6.0–8.5)

## 2018-02-22 LAB — TSH: TSH: 1.93 u[IU]/mL (ref 0.450–4.500)

## 2018-02-22 LAB — PSA: Prostate Specific Ag, Serum: 1.8 ng/mL (ref 0.0–4.0)

## 2018-02-27 IMAGING — DX DG ANKLE COMPLETE 3+V*L*
4 series · 4 of 4 positions shown · non-contrast
Comparison: None

CLINICAL DATA: Sprained left ankle.

EXAM:
LEFT ANKLE COMPLETE - 3+ VIEW

[ankle ap]
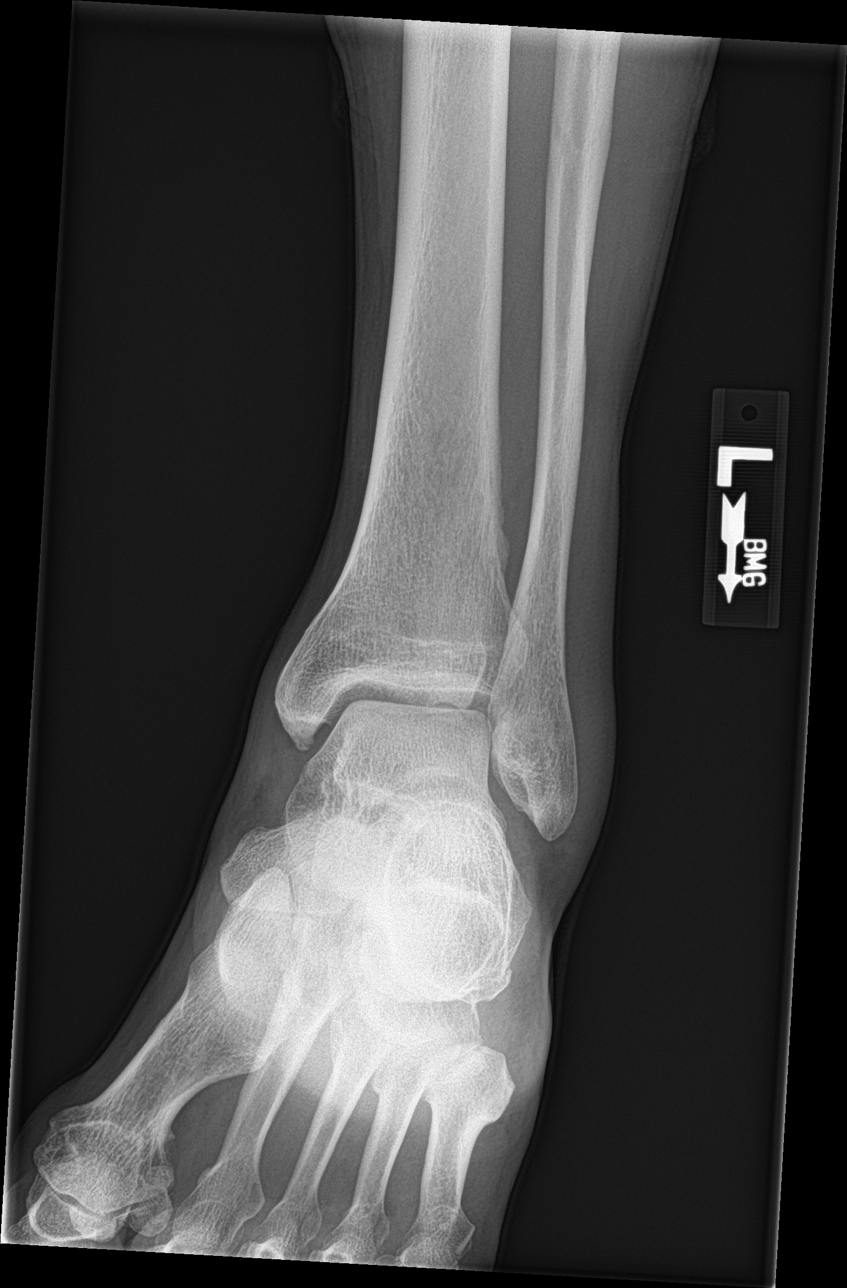

[ankle obl (1 of 2)]
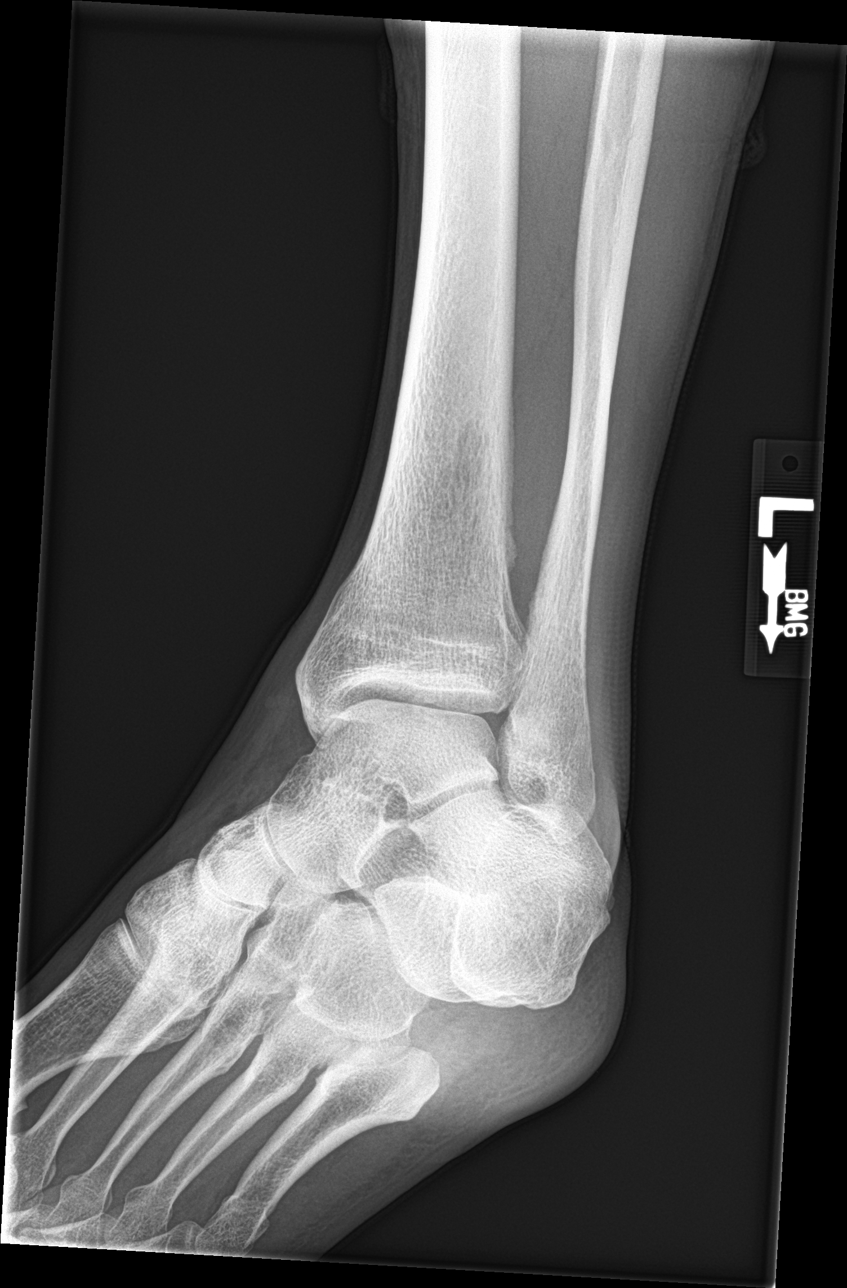

[ankle obl (2 of 2)]
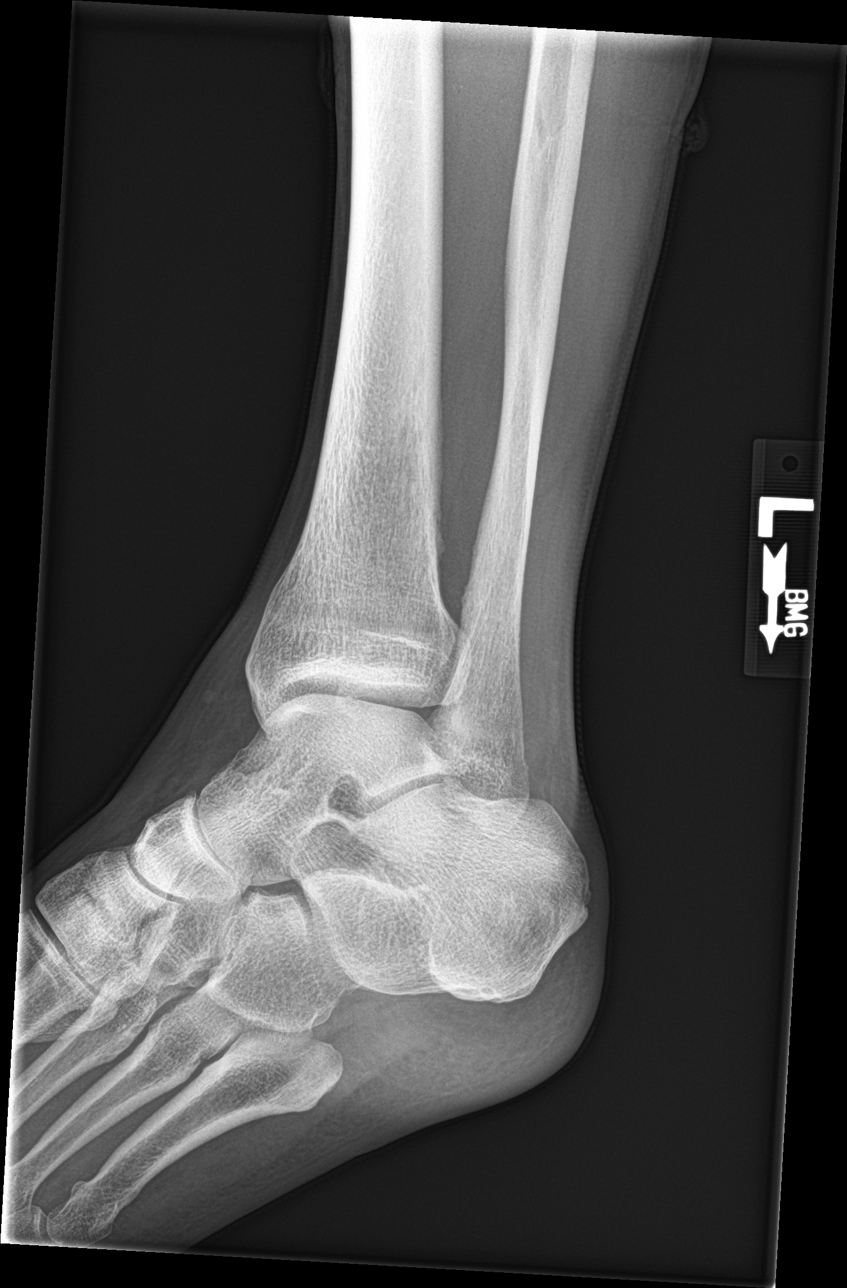

[ankle lat]
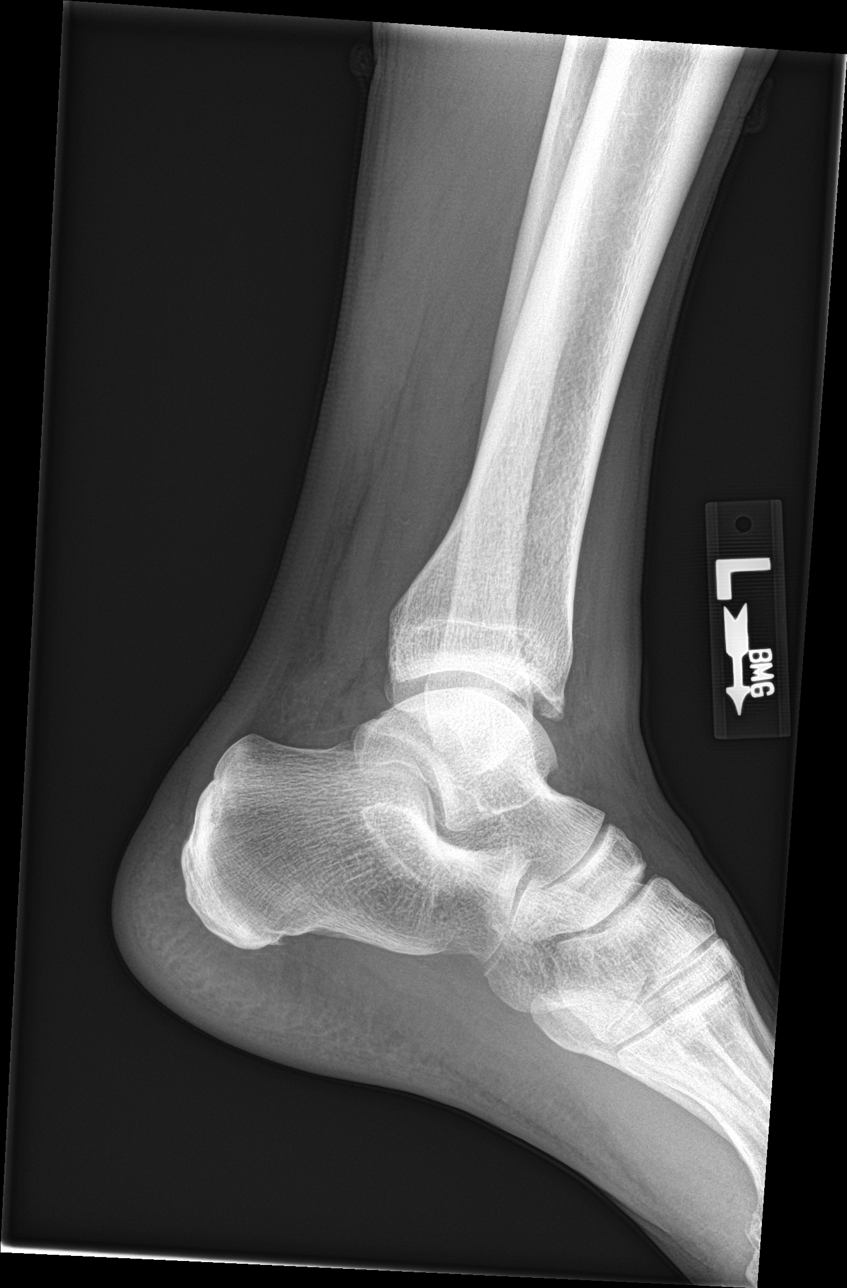

[4 of 4 positions shown; findings below may reference images not displayed]

FINDINGS: Moderate lateral soft tissue swelling identified. There is no
underlying fracture or subluxation. No radio-opaque foreign bodies.
IMPRESSION: 1. Lateral soft tissue swelling compatible with the clinical history
of sprain.
2. No acute bony abnormality identified.

## 2018-08-21 DIAGNOSIS — M25552 Pain in left hip: Secondary | ICD-10-CM | POA: Diagnosis not present

## 2019-03-21 DIAGNOSIS — Z20828 Contact with and (suspected) exposure to other viral communicable diseases: Secondary | ICD-10-CM | POA: Diagnosis not present

## 2019-03-21 DIAGNOSIS — R05 Cough: Secondary | ICD-10-CM | POA: Diagnosis not present

## 2019-05-05 ENCOUNTER — Telehealth: Payer: Self-pay | Admitting: Family Medicine

## 2019-05-05 NOTE — Telephone Encounter (Signed)
Called pt LVM that physical for 04.08.2021 needed to be cancelled due to provider not available. Asked that patient call back to reschedule

## 2019-05-28 ENCOUNTER — Encounter: Payer: BLUE CROSS/BLUE SHIELD | Admitting: Family Medicine

## 2019-06-10 ENCOUNTER — Telehealth: Payer: Self-pay | Admitting: Family Medicine

## 2019-06-10 NOTE — Telephone Encounter (Signed)
Please place labs prior to appt.

## 2019-06-10 NOTE — Telephone Encounter (Signed)
Patient has labs scheduled for 06/11/19 for physical dos 06/16/19 please put orders in

## 2019-06-11 ENCOUNTER — Ambulatory Visit: Payer: BC Managed Care – PPO

## 2019-06-11 ENCOUNTER — Other Ambulatory Visit: Payer: Self-pay

## 2019-06-11 DIAGNOSIS — Z0000 Encounter for general adult medical examination without abnormal findings: Secondary | ICD-10-CM | POA: Diagnosis not present

## 2019-06-11 NOTE — Progress Notes (Signed)
Patient came in for labs for his upcoming appt .on 06/16/19. There were no future orders placed. I placed the orders when patient arrived.

## 2019-06-12 LAB — CBC WITH DIFFERENTIAL/PLATELET
Basophils Absolute: 0.1 10*3/uL (ref 0.0–0.2)
Basos: 1 %
EOS (ABSOLUTE): 0.2 10*3/uL (ref 0.0–0.4)
Eos: 4 %
Hematocrit: 42.7 % (ref 37.5–51.0)
Hemoglobin: 14.7 g/dL (ref 13.0–17.7)
Immature Grans (Abs): 0 10*3/uL (ref 0.0–0.1)
Immature Granulocytes: 0 %
Lymphocytes Absolute: 1.3 10*3/uL (ref 0.7–3.1)
Lymphs: 30 %
MCH: 30.5 pg (ref 26.6–33.0)
MCHC: 34.4 g/dL (ref 31.5–35.7)
MCV: 89 fL (ref 79–97)
Monocytes Absolute: 0.4 10*3/uL (ref 0.1–0.9)
Monocytes: 9 %
Neutrophils Absolute: 2.5 10*3/uL (ref 1.4–7.0)
Neutrophils: 56 %
Platelets: 217 10*3/uL (ref 150–450)
RBC: 4.82 x10E6/uL (ref 4.14–5.80)
RDW: 12.5 % (ref 11.6–15.4)
WBC: 4.3 10*3/uL (ref 3.4–10.8)

## 2019-06-12 LAB — CMP14+EGFR
ALT: 19 IU/L (ref 0–44)
AST: 25 IU/L (ref 0–40)
Albumin/Globulin Ratio: 1.6 (ref 1.2–2.2)
Albumin: 4.5 g/dL (ref 3.8–4.8)
Alkaline Phosphatase: 53 IU/L (ref 39–117)
BUN/Creatinine Ratio: 15 (ref 10–24)
BUN: 17 mg/dL (ref 8–27)
Bilirubin Total: 0.5 mg/dL (ref 0.0–1.2)
CO2: 23 mmol/L (ref 20–29)
Calcium: 9.2 mg/dL (ref 8.6–10.2)
Chloride: 105 mmol/L (ref 96–106)
Creatinine, Ser: 1.1 mg/dL (ref 0.76–1.27)
GFR calc Af Amer: 82 mL/min/{1.73_m2} (ref >59)
GFR calc non Af Amer: 71 mL/min/{1.73_m2} (ref >59)
Globulin, Total: 2.8 g/dL (ref 1.5–4.5)
Glucose: 91 mg/dL (ref 65–99)
Potassium: 4.2 mmol/L (ref 3.5–5.2)
Sodium: 142 mmol/L (ref 134–144)
Total Protein: 7.3 g/dL (ref 6.0–8.5)

## 2019-06-12 LAB — LIPID PANEL
Chol/HDL Ratio: 4.4 ratio (ref 0.0–5.0)
Cholesterol, Total: 201 mg/dL — ABNORMAL HIGH (ref 100–199)
HDL: 46 mg/dL (ref >39)
LDL Chol Calc (NIH): 135 mg/dL — ABNORMAL HIGH (ref 0–99)
Triglycerides: 112 mg/dL (ref 0–149)
VLDL Cholesterol Cal: 20 mg/dL (ref 5–40)

## 2019-06-12 LAB — TSH: TSH: 1.94 u[IU]/mL (ref 0.450–4.500)

## 2019-06-12 LAB — PSA: Prostate Specific Ag, Serum: 2.1 ng/mL (ref 0.0–4.0)

## 2019-06-16 ENCOUNTER — Ambulatory Visit (INDEPENDENT_AMBULATORY_CARE_PROVIDER_SITE_OTHER): Payer: BC Managed Care – PPO | Admitting: Family Medicine

## 2019-06-16 ENCOUNTER — Encounter: Payer: Self-pay | Admitting: Family Medicine

## 2019-06-16 ENCOUNTER — Other Ambulatory Visit: Payer: Self-pay

## 2019-06-16 VITALS — BP 132/86 | HR 67 | Ht 70.0 in | Wt 189.0 lb

## 2019-06-16 DIAGNOSIS — E7800 Pure hypercholesterolemia, unspecified: Secondary | ICD-10-CM

## 2019-06-16 DIAGNOSIS — Z0001 Encounter for general adult medical examination with abnormal findings: Secondary | ICD-10-CM

## 2019-06-16 DIAGNOSIS — Z0000 Encounter for general adult medical examination without abnormal findings: Secondary | ICD-10-CM

## 2019-06-16 NOTE — Progress Notes (Signed)
4/27/20218:33 AM  Anthony Wolf May 02, 1955, 64 y.o., male [id number]  Chief Complaint  Patient presents with  . Annual Exam    HPI:   Patient is a 64 y.o. male with past medical history significant for HLP who presents today for CPE  Last CPE Jan 2020  Colorectal Cancer Screening: 2014 Prostate Cancer Screening: 2021 HIV Screening: 2018 Seasonal Influenza Vaccination: did not get this season Pneumococcal Vaccination: at age 37 Zoster Vaccination: declines Covid: has received his first dose, due for his 2nd next week Frequency of Dental evaluation: has partials, usually goes to dentist in [place], did not have dental care this past year Frequency of Eye evaluation: wears eyeglasses, [facility]  Labs reviewed LDL 135, last year 150 Has been working on [employer]  The 10-year ASCVD risk score Mikey Bussing DC Jr., et al., 2013) is: 12.2%   Hearing Screening   125Hz  250Hz  500Hz  1000Hz  2000Hz  3000Hz  4000Hz  6000Hz  8000Hz   Right ear:           Left ear:             Visual Acuity Screening   Right eye Left eye Both eyes  Without correction:     With correction: 2015 2040 2015    Most Recent Immunizations  Administered Date(s) Administered  . Td 11/10/2014   Health Maintenance Due  Topic Date Due  . COVID-19 Vaccine (1) Never done    Depression screen [facility] 2/9 06/16/2019 02/21/2018 11/23/2016  Decreased Interest 0 0 0  Down, Depressed, Hopeless 0 0 0  PHQ - 2 Score 0 0 0    Fall Risk  06/16/2019 02/21/2018 11/23/2016 01/10/2016 11/19/2015  Falls in the past year? 0 0 No No No  Number falls in past yr: 0 - - - -  Injury with Fall? 0 - - - -  Follow up Falls evaluation completed - - - -     No Known Allergies  Prior to Admission medications   Medication Sig Start Date End Date Taking? Authorizing Provider  CALCIUM-MAGNESIUM-ZINC PO Take by mouth.   Yes [provider]  Cholecalciferol (VITAMIN D-3 PO) Take by mouth.   Yes [provider]  vitamin C (ASCORBIC  ACID) 250 MG tablet Take 250 mg by mouth daily.   Yes [provider]    Past Medical History:  Diagnosis Date  . Rotator cuff injury     Past Surgical History:  Procedure Laterality Date  . colonscopy     08/11/2012-internal hemorrhoids, repeat in 2024  . ROTATOR CUFF REPAIR      Social History   Tobacco Use  . Smoking status: Former Research scientist (life sciences)  . Smokeless tobacco: Never Used  Substance Use Topics  . Alcohol use: No    Family History  Problem Relation Age of Onset  . Cancer Mother   . Stroke Father   . Hypertension Father   . Asthma Maternal Grandmother     Review of Systems  Constitutional: Negative for chills, fever and malaise/fatigue.  HENT: Negative for hearing loss.   Eyes: Negative for blurred vision and double vision.  Respiratory: Negative for cough and shortness of breath.   Cardiovascular: Negative for chest pain, palpitations and leg swelling.  Gastrointestinal: Negative for abdominal pain, blood in stool, melena, nausea and vomiting.  Genitourinary: Negative for frequency and urgency.  Musculoskeletal: Negative for joint pain and myalgias.  Neurological: Negative for dizziness, tingling and focal weakness.  Endo/Heme/Allergies: Positive for environmental allergies. Negative for polydipsia.  Psychiatric/Behavioral: Negative  for depression. The patient is not nervous/anxious and does not have insomnia.   All other systems reviewed and are negative.    OBJECTIVE:  Today's Vitals   06/16/19 0819  BP: 132/86  Pulse: 67  SpO2: 97%  Weight: 189 lb (85.7 kg)  Height: 5\' 10"  (1.778 m)   Body mass index is 27.12 kg/m.   Wt Readings from Last 3 Encounters:  06/16/19 189 lb (85.7 kg)  02/21/18 199 lb 12.8 oz (90.6 kg)  11/23/16 191 lb (86.6 kg)     Physical Exam Vitals and nursing note reviewed.  Constitutional:      Appearance: He is well-developed.  HENT:     Head: Normocephalic and atraumatic.     Right Ear: Hearing, tympanic  membrane, ear canal and external ear normal.     Left Ear: Hearing, tympanic membrane, ear canal and external ear normal.     Mouth/Throat:     Pharynx: No oropharyngeal exudate.  Eyes:     Extraocular Movements: Extraocular movements intact.     Conjunctiva/sclera: Conjunctivae normal.     Pupils: Pupils are equal, round, and reactive to light.  Neck:     Thyroid: No thyromegaly.  Cardiovascular:     Rate and Rhythm: Normal rate and regular rhythm.     Pulses: Normal pulses.     Heart sounds: Normal heart sounds. No murmur. No friction rub. No gallop.   Pulmonary:     Effort: Pulmonary effort is normal.     Breath sounds: Normal breath sounds. No wheezing, rhonchi or rales.  Abdominal:     General: Bowel sounds are normal. There is no distension.     Palpations: Abdomen is soft. There is no mass.     Tenderness: There is no abdominal tenderness.  Musculoskeletal:        General: Normal range of motion.     Cervical back: Neck supple.     Right lower leg: No edema.     Left lower leg: No edema.  Lymphadenopathy:     Cervical: No cervical adenopathy.  Skin:    General: Skin is warm and dry.  Neurological:     Mental Status: He is alert and oriented to person, place, and time.     Cranial Nerves: No cranial nerve deficit.     Coordination: Coordination normal.     Gait: Gait normal.     Deep Tendon Reflexes: Reflexes are normal and symmetric.  Psychiatric:        Mood and Affect: Mood normal.        Behavior: Behavior normal.     No results found for this or any previous visit (from the past 24 hour(s)).  No results found.   ASSESSMENT and PLAN  1. Annual physical exam No concerns per history or exam. Routine HCM labs ordered. HCM reviewed/discussed. Anticipatory guidance regarding healthy weight, lifestyle and choices given.  Will request proof of covid vaccine from [facility]  2. Pure hypercholesterolemia Improved. Discussed ASCVD risk. Declines medical  intervention at this time. Cont working on 01/23/17.  Other orders   Return in about 1 year (around 06/15/2020) for CPE with fasting labs 3-4 days prior.    06/17/2020, MD [facility] [address] [place], [place] Ph.  [phone] Fax [phone]

## 2019-06-16 NOTE — Patient Instructions (Addendum)
   If you have lab work done today you will be contacted with your lab results within the next 2 weeks.  If you have not heard from us then please contact us. The fastest way to get your results is to register for My Chart.   IF you received an x-ray today, you will receive an invoice from [facility]. Please contact [facility] at [phone] with questions or concerns regarding your invoice.   IF you received labwork today, you will receive an invoice from [facility]. Please contact [facility] at [phone] with questions or concerns regarding your invoice.   Our billing staff will not be able to assist you with questions regarding bills from these companies.  You will be contacted with the lab results as soon as they are available. The fastest way to get your results is to activate your My Chart account. Instructions are located on the last page of this paperwork. If you have not heard from us regarding the results in 2 weeks, please contact this office.     Preventive Care 40-64 Years Old, Male Preventive care refers to lifestyle choices and visits with your health care provider that can promote health and wellness. This includes:  A yearly physical exam. This is also called an annual well check.  Regular dental and eye exams.  Immunizations.  Screening for certain conditions.  Healthy lifestyle choices, such as eating a healthy diet, getting regular exercise, not using drugs or products that contain nicotine and tobacco, and limiting alcohol use. What can I expect for my preventive care visit? Physical exam Your health care provider will check:  Height and weight. These may be used to calculate body mass index (BMI), which is a measurement that tells if you are at a healthy weight.  Heart rate and blood pressure.  Your skin for abnormal spots. Counseling Your health care provider may ask you questions about:  Alcohol, tobacco, and drug use.  Emotional  well-being.  Home and relationship well-being.  Sexual activity.  Eating habits.  Work and work environment. What immunizations do I need?  Influenza (flu) vaccine  This is recommended every year. Tetanus, diphtheria, and pertussis (Tdap) vaccine  You may need a Td booster every 10 years. Varicella (chickenpox) vaccine  You may need this vaccine if you have not already been vaccinated. Zoster (shingles) vaccine  You may need this after age 60. Measles, mumps, and rubella (MMR) vaccine  You may need at least one dose of MMR if you were born in 1957 or later. You may also need a second dose. Pneumococcal conjugate (PCV13) vaccine  You may need this if you have certain conditions and were not previously vaccinated. Pneumococcal polysaccharide (PPSV23) vaccine  You may need one or two doses if you smoke cigarettes or if you have certain conditions. Meningococcal conjugate (MenACWY) vaccine  You may need this if you have certain conditions. Hepatitis A vaccine  You may need this if you have certain conditions or if you travel or work in places where you may be exposed to hepatitis A. Hepatitis B vaccine  You may need this if you have certain conditions or if you travel or work in places where you may be exposed to hepatitis B. Haemophilus influenzae type b (Hib) vaccine  You may need this if you have certain risk factors. Human papillomavirus (HPV) vaccine  If recommended by your health care provider, you may need three doses over 6 months. You may receive vaccines as individual doses   or as more than one vaccine together in one shot (combination vaccines). Talk with your health care provider about the risks and benefits of combination vaccines. What tests do I need? Blood tests  Lipid and cholesterol levels. These may be checked every 5 years, or more frequently if you are over 50 years old.  Hepatitis C test.  Hepatitis B test. Screening  Lung cancer screening.  You may have this screening every year starting at age 55 if you have a 30-pack-year history of smoking and currently smoke or have quit within the past 15 years.  Prostate cancer screening. Recommendations will vary depending on your family history and other risks.  Colorectal cancer screening. All adults should have this screening starting at age 50 and continuing until age 75. Your health care provider may recommend screening at age 45 if you are at increased risk. You will have tests every 1-10 years, depending on your results and the type of screening test.  Diabetes screening. This is done by checking your blood sugar (glucose) after you have not eaten for a while (fasting). You may have this done every 1-3 years.  Sexually transmitted disease (STD) testing. Follow these instructions at home: Eating and drinking  Eat a diet that includes fresh fruits and vegetables, whole grains, lean protein, and low-fat dairy products.  Take vitamin and mineral supplements as recommended by your health care provider.  Do not drink alcohol if your health care provider tells you not to drink.  If you drink alcohol: ? Limit how much you have to 0-2 drinks a day. ? Be aware of how much alcohol is in your drink. In the [place] one drink equals one 12 oz bottle of beer (355 mL), one 5 oz glass of wine (148 mL), or one 1 oz glass of hard liquor (44 mL). Lifestyle  Take daily care of your teeth and gums.  Stay active. Exercise for at least 30 minutes on 5 or more days each week.  Do not use any products that contain nicotine or tobacco, such as cigarettes, e-cigarettes, and chewing tobacco. If you need help quitting, ask your health care provider.  If you are sexually active, practice safe sex. Use a condom or other form of protection to prevent STIs (sexually transmitted infections).  Talk with your health care provider about taking a low-dose aspirin every day starting at age 50. What's next?  Go  to your health care provider once a year for a well check visit.  Ask your health care provider how often you should have your eyes and teeth checked.  Stay up to date on all vaccines. This information is not intended to replace advice given to you by your health care provider. Make sure you discuss any questions you have with your health care provider. Document Revised: 01/30/2018 Document Reviewed: 01/30/2018 Elsevier Patient Education  2020 Elsevier Inc.  

## 2019-09-01 DIAGNOSIS — Z1152 Encounter for screening for COVID-19: Secondary | ICD-10-CM | POA: Diagnosis not present

## 2020-06-13 ENCOUNTER — Ambulatory Visit: Payer: BC Managed Care – PPO

## 2020-06-16 ENCOUNTER — Encounter: Payer: BC Managed Care – PPO | Admitting: Family Medicine

## 2020-09-02 ENCOUNTER — Encounter: Payer: Self-pay | Admitting: Family Medicine

## 2020-09-02 ENCOUNTER — Other Ambulatory Visit: Payer: Self-pay

## 2020-09-02 ENCOUNTER — Ambulatory Visit (INDEPENDENT_AMBULATORY_CARE_PROVIDER_SITE_OTHER): Payer: BC Managed Care – PPO | Admitting: Family Medicine

## 2020-09-02 VITALS — BP 110/80 | HR 57 | Temp 97.5°F | Ht 70.5 in | Wt 189.4 lb

## 2020-09-02 DIAGNOSIS — E7800 Pure hypercholesterolemia, unspecified: Secondary | ICD-10-CM | POA: Diagnosis not present

## 2020-09-02 DIAGNOSIS — Z0000 Encounter for general adult medical examination without abnormal findings: Secondary | ICD-10-CM | POA: Diagnosis not present

## 2020-09-02 DIAGNOSIS — Z125 Encounter for screening for malignant neoplasm of prostate: Secondary | ICD-10-CM | POA: Diagnosis not present

## 2020-09-02 LAB — COMPREHENSIVE METABOLIC PANEL
ALT: 16 U/L (ref 0–53)
AST: 16 U/L (ref 0–37)
Albumin: 4.2 g/dL (ref 3.5–5.2)
Alkaline Phosphatase: 42 U/L (ref 39–117)
BUN: 18 mg/dL (ref 6–23)
CO2: 26 mEq/L (ref 19–32)
Calcium: 8.9 mg/dL (ref 8.4–10.5)
Chloride: 106 mEq/L (ref 96–112)
Creatinine, Ser: 0.99 mg/dL (ref 0.40–1.50)
GFR: 80.36 mL/min (ref >60.00)
Glucose, Bld: 89 mg/dL (ref 70–99)
Potassium: 4.1 mEq/L (ref 3.5–5.1)
Sodium: 139 mEq/L (ref 135–145)
Total Bilirubin: 0.7 mg/dL (ref 0.2–1.2)
Total Protein: 7.1 g/dL (ref 6.0–8.3)

## 2020-09-02 LAB — LIPID PANEL
Cholesterol: 214 mg/dL — ABNORMAL HIGH (ref 0–200)
HDL: 37.4 mg/dL — ABNORMAL LOW (ref >39.00)
LDL Cholesterol: 145 mg/dL — ABNORMAL HIGH (ref 0–99)
NonHDL: 176.49
Total CHOL/HDL Ratio: 6
Triglycerides: 158 mg/dL — ABNORMAL HIGH (ref 0.0–149.0)
VLDL: 31.6 mg/dL (ref 0.0–40.0)

## 2020-09-02 LAB — CBC
HCT: 40.4 % (ref 39.0–52.0)
Hemoglobin: 14.1 g/dL (ref 13.0–17.0)
MCHC: 35 g/dL (ref 30.0–36.0)
MCV: 88.9 fl (ref 78.0–100.0)
Platelets: 196 10*3/uL (ref 150.0–400.0)
RBC: 4.54 Mil/uL (ref 4.22–5.81)
RDW: 12.7 % (ref 11.5–15.5)
WBC: 4.2 10*3/uL (ref 4.0–10.5)

## 2020-09-02 LAB — PSA: PSA: 2.1 ng/mL (ref 0.10–4.00)

## 2020-09-02 NOTE — Progress Notes (Signed)
Anthony Wolf is a 65 y.o. male  Chief Complaint  Patient presents with   New Patient (Initial Visit)    Pt here for CPE, he is fasting today.  Pt has no concerns to address today.    HPI: Anthony Wolf is a 65 y.o. male patient seen today to establish care and for annual CPE, labs. Previous PCP Dr. Leretha Pol. Pt is aware I'm leaving [place] in a few weeks and plans to establish with another PCP. Pt walks daily, healthy diet. No acute issues/concerns.  Last Colonoscopy: 07/2012 - due in 07/2022  Dental: goes to dentist annually in [place] Vision: wears glasses and UTD on exam  Med refills needed today: n/a  Past Medical History:  Diagnosis Date   Rotator cuff injury     Past Surgical History:  Procedure Laterality Date   colonscopy     08/11/2012-internal hemorrhoids, repeat in 2024   ROTATOR CUFF REPAIR     SHOULDER SURGERY Right 2010    Social History   Socioeconomic History   Marital status: Married    Spouse name: Not on file   Number of children: Not on file   Years of education: Not on file   Highest education level: Not on file  Occupational History   Not on file  Tobacco Use   Smoking status: Former   Smokeless tobacco: Never  Substance and Sexual Activity   Alcohol use: No   Drug use: No   Sexual activity: Not on file  Other Topics Concern   Not on file  Social History Narrative   Not on file   Social Determinants of Health   Financial Resource Strain: Not on file  Food Insecurity: Not on file  Transportation Needs: Not on file  Physical Activity: Not on file  Stress: Not on file  Social Connections: Not on file  Intimate Partner Violence: Not on file    Family History  Problem Relation Age of Onset   Cancer Mother    Stroke Father    Hypertension Father    Asthma Maternal Grandmother      Immunization History  Administered Date(s) Administered   Td 11/10/2014    Outpatient Encounter Medications as of 09/02/2020  Medication Sig    CALCIUM-MAGNESIUM-ZINC PO Take by mouth.   Cholecalciferol (VITAMIN D-3 PO) Take by mouth.   vitamin C (ASCORBIC ACID) 250 MG tablet Take 250 mg by mouth daily.   No facility-administered encounter medications on file as of 09/02/2020.     ROS: Gen: no fever, chills  Skin: no rash, itching ENT: no ear pain, ear drainage, nasal congestion, rhinorrhea, sinus pressure, sore throat Eyes: no blurry vision, double vision Resp: no cough, wheeze,SOB CV: no CP, palpitations, LE edema,  GI: no heartburn, n/v/d/c, abd pain GU: no dysuria, urgency, frequency, hematuria; no testicular swelling or masses MSK: occasional Lt hip pain Neuro: no dizziness, headache, weakness, vertigo Psych: no depression, anxiety, insomnia   No Known Allergies  BP 110/80 (BP Location: Left Arm, Patient Position: Sitting, Cuff Size: Normal)   Pulse (!) 57   Temp (!) 97.5 F (36.4 C) (Temporal)   Ht 5' 10.5" (1.791 m)   Wt 189 lb 6.4 oz (85.9 kg)   SpO2 95%   BMI 26.79 kg/m  Wt Readings from Last 3 Encounters:  09/02/20 189 lb 6.4 oz (85.9 kg)  06/16/19 189 lb (85.7 kg)  02/21/18 199 lb 12.8 oz (90.6 kg)   Temp Readings from Last 3 Encounters:  09/02/20 Marland Kitchen)  97.5 F (36.4 C) (Temporal)  02/21/18 (!) 97.5 F (36.4 C) (Oral)  11/23/16 98.6 F (37 C) (Oral)   BP Readings from Last 3 Encounters:  09/02/20 110/80  06/16/19 132/86  02/21/18 135/83   Pulse Readings from Last 3 Encounters:  09/02/20 (!) 57  06/16/19 67  02/21/18 65     Physical Exam Constitutional:      General: He is not in acute distress.    Appearance: He is well-developed.  HENT:     Head: Normocephalic and atraumatic.     Right Ear: Tympanic membrane and ear canal normal.     Left Ear: Tympanic membrane and ear canal normal.     Nose: Nose normal.     Mouth/Throat:     Mouth: Mucous membranes are moist.     Pharynx: Oropharynx is clear.  Eyes:     Conjunctiva/sclera: Conjunctivae normal.  Neck:     Thyroid: No  thyromegaly.  Cardiovascular:     Rate and Rhythm: Normal rate and regular rhythm.     Pulses: Normal pulses.  Pulmonary:     Effort: Pulmonary effort is normal.     Breath sounds: Normal breath sounds. No wheezing or rhonchi.  Abdominal:     General: Bowel sounds are normal. There is no distension.     Palpations: Abdomen is soft. There is no mass.     Tenderness: There is no abdominal tenderness. There is no guarding or rebound.  Musculoskeletal:     Cervical back: Neck supple.     Right lower leg: No edema.     Left lower leg: No edema.  Lymphadenopathy:     Cervical: No cervical adenopathy.  Skin:    General: Skin is warm and dry.  Neurological:     Mental Status: He is alert and oriented to person, place, and time.     Motor: No abnormal muscle tone.     Coordination: Coordination normal.  Psychiatric:        Mood and Affect: Mood normal.        Behavior: Behavior normal.     A/P:  1. Annual physical exam - discussed importance of regular CV exercise, healthy diet, adequate sleep - UTD on colonoscopy - due in 2024 - UTD vision exam, dental exam annually in [place] - immunizations UTD - CBC - Comprehensive metabolic panel - next CPE in 1 year  2. Pure hypercholesterolemia - Lipid panel - Comprehensive metabolic panel  3. Screening for prostate cancer - PSA    This visit occurred during the SARS-CoV-2 public health emergency.  Safety protocols were in place, including screening questions prior to the visit, additional usage of staff PPE, and extensive cleaning of exam room while observing appropriate contact time as indicated for disinfecting solutions.

## 2020-09-21 ENCOUNTER — Other Ambulatory Visit: Payer: Self-pay | Admitting: Family Medicine

## 2020-09-21 ENCOUNTER — Encounter: Payer: Self-pay | Admitting: Family Medicine

## 2020-09-21 DIAGNOSIS — E782 Mixed hyperlipidemia: Secondary | ICD-10-CM

## 2020-09-21 MED ORDER — ATORVASTATIN CALCIUM 20 MG PO TABS: 20.0000 mg | ORAL_TABLET | Freq: Every day | ORAL | 3 refills | Status: DC

## 2021-10-06 ENCOUNTER — Encounter: Payer: Self-pay | Admitting: Family Medicine

## 2021-10-06 ENCOUNTER — Ambulatory Visit (INDEPENDENT_AMBULATORY_CARE_PROVIDER_SITE_OTHER): Payer: BC Managed Care – PPO | Admitting: Family Medicine

## 2021-10-06 VITALS — BP 118/78 | HR 65 | Temp 97.5°F | Ht 70.5 in | Wt 198.4 lb

## 2021-10-06 DIAGNOSIS — Z5320 Procedure and treatment not carried out because of patient's decision for unspecified reasons: Secondary | ICD-10-CM

## 2021-10-06 DIAGNOSIS — E663 Overweight: Secondary | ICD-10-CM

## 2021-10-06 DIAGNOSIS — E785 Hyperlipidemia, unspecified: Secondary | ICD-10-CM | POA: Diagnosis not present

## 2021-10-06 DIAGNOSIS — Z2821 Immunization not carried out because of patient refusal: Secondary | ICD-10-CM | POA: Diagnosis not present

## 2021-10-06 DIAGNOSIS — Z0000 Encounter for general adult medical examination without abnormal findings: Secondary | ICD-10-CM

## 2021-10-06 DIAGNOSIS — Z136 Encounter for screening for cardiovascular disorders: Secondary | ICD-10-CM | POA: Insufficient documentation

## 2021-10-06 DIAGNOSIS — Z87891 Personal history of nicotine dependence: Secondary | ICD-10-CM

## 2021-10-06 DIAGNOSIS — Z125 Encounter for screening for malignant neoplasm of prostate: Secondary | ICD-10-CM | POA: Diagnosis not present

## 2021-10-06 NOTE — Assessment & Plan Note (Signed)
Patient offered shingles, COVID booster, pneumonia shot, declined these at this time

## 2021-10-06 NOTE — Assessment & Plan Note (Addendum)
Elevated ASCVD risk greater than 10% Did discuss the risk associated with this including heart attack and stroke Recommended statin again, however patient preferred to work on diet and exercise Check lipid panel, and other restratification labs as ordered

## 2021-10-06 NOTE — Progress Notes (Signed)
Chief Complaint:  Anthony Wolf is a 66 y.o. male who presents today for his annual comprehensive physical exam.    Assessment/Plan:   Problem List Items Addressed This Visit       Other   HLD (hyperlipidemia)    Elevated ASCVD risk greater than 10% Did discuss the risk associated with this including heart attack and stroke Recommended statin again, however patient preferred to work on diet and exercise Check lipid panel, and other restratification labs as ordered      Relevant Orders   Basic metabolic panel   Hemoglobin A1c   Lipid panel   Overweight (BMI 25.0-29.9)   Screening for AAA (abdominal aortic aneurysm)    Remote history, former smoker with 20+ -pack-year history      Relevant Orders   [place] [facility] SCREENING   Immunization declined - Primary    Patient offered shingles, COVID booster, pneumonia shot, declined these at this time      Other Visit Diagnoses     Statin declined       Screening for prostate cancer       Relevant Orders   PSA   Annual physical exam       Former smoker            Patient Counseling(The following topics were reviewed and/or handout was given):  -Nutrition: Stressed importance of moderation in sodium/caffeine intake, saturated fat and cholesterol, caloric balance, sufficient intake of fresh fruits, vegetables, and fiber.  -Stressed the importance of regular exercise.   -Substance Abuse: Discussed cessation/primary prevention of tobacco, alcohol, or other drug use; driving or other dangerous activities under the influence; availability of treatment for abuse.   -Injury prevention: Discussed safety belts, safety helmets, smoke detector, smoking near bedding or upholstery.   -Sexuality: Discussed sexually transmitted diseases, partner selection, use of condoms, avoidance of unintended pregnancy and contraceptive alternatives.   -Dental health: Discussed importance of regular tooth brushing, flossing, and dental visits.   -Health maintenance and immunizations reviewed. Please refer to Health maintenance section.  Return to care in 1 year for next preventative visit.     Subjective:  HPI:  He has no acute complaints today.   Lifestyle Diet: Working on healthy diet  Hyperlipidemia, established problem,  The 10-year ASCVD risk score (Arnett DK, et al., 2019) is: 13.7%   Values used to calculate the score:     Age: 4 years     Sex: Male     Is Non-Hispanic African American: No     Diabetic: No     Tobacco smoker: No     Systolic Blood Pressure: 118 mmHg     Is BP treated: No     HDL Cholesterol: 37.4 mg/dL     Total Cholesterol: 214 mg/dL  Current medication(s): Patient previous prescribed atorvastatin, however he did not take it because he wanted to work on diet started on.  Compliant without side effects.  ROS: No chest pain or shortness of breath. No myalgias.      10/06/2021   10:43 AM  Depression screen PHQ 2/9  Decreased Interest 0  Down, Depressed, Hopeless 0  PHQ - 2 Score 0    Health Maintenance Due  Topic Date Due   INFLUENZA VACCINE  09/19/2021     Dental: UTD Vision: UTD   ROS: As per HPI, otherwise all systems reviewed and are negative  PMH:  The following were reviewed and entered/updated in epic: Past Medical History:  Diagnosis Date  Rotator cuff injury    Patient Active Problem List   Diagnosis Date Noted   Overweight (BMI 25.0-29.9) 10/06/2021   Screening for AAA (abdominal aortic aneurysm) 10/06/2021   Immunization declined 10/06/2021   HLD (hyperlipidemia) 11/19/2015   Past Surgical History:  Procedure Laterality Date   colonscopy     08/11/2012-internal hemorrhoids, repeat in 2024   ROTATOR CUFF REPAIR     SHOULDER SURGERY Right 2010    Family History  Problem Relation Age of Onset   Cancer Mother    Stroke Father    Hypertension Father    Asthma Maternal Grandmother     Medications- reviewed and updated Current Outpatient Medications   Medication Sig Dispense Refill   CALCIUM-MAGNESIUM-ZINC PO Take by mouth.     Cholecalciferol (VITAMIN D-3 PO) Take by mouth.     vitamin C (ASCORBIC ACID) 250 MG tablet Take 250 mg by mouth daily.     atorvastatin (LIPITOR) 20 MG tablet Take 1 tablet (20 mg total) by mouth daily. (Patient not taking: Reported on 10/06/2021) 90 tablet 3   No current facility-administered medications for this visit.    Allergies-reviewed and updated No Known Allergies  Social History   Socioeconomic History   Marital status: Married    Spouse name: Not on file   Number of children: Not on file   Years of education: Not on file   Highest education level: Not on file  Occupational History   Not on file  Tobacco Use   Smoking status: Former    Packs/day: 2.00    Years: 10.00    Total pack years: 20.00    Types: Cigarettes    Passive exposure: Never   Smokeless tobacco: Never  Vaping Use   Vaping Use: Never used  Substance and Sexual Activity   Alcohol use: No   Drug use: No   Sexual activity: Not on file  Other Topics Concern   Not on file  Social History Narrative   Not on file   Social Determinants of Health   Financial Resource Strain: Not on file  Food Insecurity: Not on file  Transportation Needs: Not on file  Physical Activity: Not on file  Stress: Not on file  Social Connections: Not on file        Objective:  Physical Exam: BP 118/78 (BP Location: Left Arm, Patient Position: Sitting, Cuff Size: Large)   Pulse 65   Temp (!) 97.5 F (36.4 C) (Temporal)   Ht 5' 10.5" (1.791 m)   Wt 198 lb 6.4 oz (90 kg)   SpO2 98%   BMI 28.07 kg/m   Body mass index is 28.07 kg/m. Wt Readings from Last 3 Encounters:  10/06/21 198 lb 6.4 oz (90 kg)  09/02/20 189 lb 6.4 oz (85.9 kg)  06/16/19 189 lb (85.7 kg)    Gen: NAD, resting comfortably CV: RRR with no murmurs appreciated Pulm: NWOB, CTAB with no crackles, wheezes, or rhonchi GI: Normal bowel sounds present. Soft,  Nontender, Nondistended. MSK: no edema, cyanosis, or clubbing noted Skin: warm, dry Neuro: grossly normal, moves all extremities Psych: Normal affect and thought content      At today's visit, we discussed treatment options, associated risk and benefits, and engage in counseling as needed.  Additionally the following were reviewed: Past medical records, past medical and surgical history, family and social background, as well as relevant laboratory results, imaging findings, and specialty notes, where applicable.  This message was generated using dictation software, and as  a result, it may contain unintentional typos or errors.  Nevertheless, extensive effort was made to accurately convey at the pertinent aspects of the patient visit.    There may have been are other unrelated non-urgent complaints, but due to the busy schedule and the amount of time already spent with him, time does not permit to address these issues at today's visit. Another appointment may have or has been requested to review these additional issues.   Thomes Dinning, MD, MS

## 2021-10-06 NOTE — Assessment & Plan Note (Signed)
Remote history, former smoker with 20+ -pack-year history

## 2021-10-10 ENCOUNTER — Ambulatory Visit (HOSPITAL_COMMUNITY)
Admission: RE | Admit: 2021-10-10 | Discharge: 2021-10-10 | Disposition: A | Discharge status: Home / Self Care | Payer: BC Managed Care – PPO | Source: Ambulatory Visit | Attending: Family Medicine | Admitting: Family Medicine

## 2021-10-10 ENCOUNTER — Other Ambulatory Visit: Payer: Self-pay | Admitting: Family Medicine

## 2021-10-10 DIAGNOSIS — I714 Abdominal aortic aneurysm, without rupture, unspecified: Secondary | ICD-10-CM

## 2021-10-10 DIAGNOSIS — Z136 Encounter for screening for cardiovascular disorders: Secondary | ICD-10-CM | POA: Diagnosis not present

## 2021-10-17 ENCOUNTER — Other Ambulatory Visit (INDEPENDENT_AMBULATORY_CARE_PROVIDER_SITE_OTHER): Payer: BC Managed Care – PPO

## 2021-10-17 ENCOUNTER — Other Ambulatory Visit: Payer: Self-pay | Admitting: Family Medicine

## 2021-10-17 DIAGNOSIS — E782 Mixed hyperlipidemia: Secondary | ICD-10-CM

## 2021-10-17 DIAGNOSIS — Z125 Encounter for screening for malignant neoplasm of prostate: Secondary | ICD-10-CM | POA: Diagnosis not present

## 2021-10-17 DIAGNOSIS — E785 Hyperlipidemia, unspecified: Secondary | ICD-10-CM | POA: Diagnosis not present

## 2021-10-17 LAB — BASIC METABOLIC PANEL
BUN: 18 mg/dL (ref 6–23)
CO2: 26 mEq/L (ref 19–32)
Calcium: 9.2 mg/dL (ref 8.4–10.5)
Chloride: 104 mEq/L (ref 96–112)
Creatinine, Ser: 1.1 mg/dL (ref 0.40–1.50)
GFR: 70.26 mL/min (ref >60.00)
Glucose, Bld: 89 mg/dL (ref 70–99)
Potassium: 4.1 mEq/L (ref 3.5–5.1)
Sodium: 141 mEq/L (ref 135–145)

## 2021-10-17 LAB — PSA: PSA: 2.34 ng/mL (ref 0.10–4.00)

## 2021-10-17 LAB — HEMOGLOBIN A1C: Hgb A1c MFr Bld: 5.2 % (ref 4.6–6.5)

## 2021-10-17 LAB — LIPID PANEL
Cholesterol: 208 mg/dL — ABNORMAL HIGH (ref 0–200)
HDL: 35.8 mg/dL — ABNORMAL LOW (ref >39.00)
NonHDL: 171.91
Total CHOL/HDL Ratio: 6
Triglycerides: 246 mg/dL — ABNORMAL HIGH (ref 0.0–149.0)
VLDL: 49.2 mg/dL — ABNORMAL HIGH (ref 0.0–40.0)

## 2021-10-17 LAB — LDL CHOLESTEROL, DIRECT: Direct LDL: 123 mg/dL

## 2021-10-17 MED ORDER — ROSUVASTATIN CALCIUM 10 MG PO TABS: 10.0000 mg | ORAL_TABLET | Freq: Every day | ORAL | 3 refills | Status: DC

## 2021-10-18 ENCOUNTER — Ambulatory Visit (INDEPENDENT_AMBULATORY_CARE_PROVIDER_SITE_OTHER): Payer: BC Managed Care – PPO | Admitting: Vascular Surgery

## 2021-10-18 ENCOUNTER — Encounter: Payer: Self-pay | Admitting: Vascular Surgery

## 2021-10-18 VITALS — BP 138/85 | HR 55 | Temp 98.2°F | Resp 18 | Ht 70.5 in | Wt 195.3 lb

## 2021-10-18 DIAGNOSIS — I7143 Infrarenal abdominal aortic aneurysm, without rupture: Secondary | ICD-10-CM

## 2021-10-18 NOTE — Progress Notes (Signed)
ASSESSMENT & PLAN   ABDOMINAL AORTIC ANEURYSM: This patient has a very small, 3.2 cm, infrarenal abdominal aortic aneurysm.  I explained we would not consider elective repair unless it reached 5.5 cm in maximum diameter.  He is not a smoker.  His blood pressure is under good control.  I have recommended a follow-up ultrasound in 3 years which I have arranged.  We will see him back at that time.  He was to call sooner if he has problems.  REASON FOR CONSULT:    Abdominal aortic aneurysm.  The consult is requested by Dr. Fanny Bien.  HPI:   Anthony Wolf is a 66 y.o. male who underwent a screening ultrasound because of his age and because he has a history of tobacco use in the past.  This demonstrated a small abdominal aortic aneurysm and was sent for vascular consultation.  He tells me that he does not have a history of hypertension and his blood pressures been under good control.  His only significant medical history is hypercholesterolemia and he is on a statin.  He quit smoking 15 years ago.  He had smoked 2 packs/day for about 10 years.  He has no family history of aneurysmal disease.  Past Medical History:  Diagnosis Date   Hyperlipidemia    Rotator cuff injury     Family History  Problem Relation Age of Onset   Cancer Mother    Stroke Father    Hypertension Father    Asthma Maternal Grandmother     SOCIAL HISTORY: Social History   Tobacco Use   Smoking status: Former    Packs/day: 2.00    Years: 10.00    Total pack years: 20.00    Types: Cigarettes    Passive exposure: Never   Smokeless tobacco: Never  Substance Use Topics   Alcohol use: No    No Known Allergies  Current Outpatient Medications  Medication Sig Dispense Refill   CALCIUM-MAGNESIUM-ZINC PO Take by mouth.     Cholecalciferol (VITAMIN D-3 PO) Take by mouth.     vitamin C (ASCORBIC ACID) 250 MG tablet Take 250 mg by mouth daily.     rosuvastatin (CRESTOR) 10 MG tablet Take 1 tablet (10 mg total)  by mouth daily. (Patient not taking: Reported on 10/18/2021) 90 tablet 3   No current facility-administered medications for this visit.    REVIEW OF SYSTEMS:  [X]  denotes positive finding, [ ]  denotes negative finding Cardiac  Comments:  Chest pain or chest pressure:    Shortness of breath upon exertion:    Short of breath when lying flat:    Irregular heart rhythm:        Vascular    Pain in calf, thigh, or hip brought on by ambulation:    Pain in feet at night that wakes you up from your sleep:     Blood clot in your veins:    Leg swelling:         Pulmonary    Oxygen at home:    Productive cough:     Wheezing:         Neurologic    Sudden weakness in arms or legs:     Sudden numbness in arms or legs:     Sudden onset of difficulty speaking or slurred speech:    Temporary loss of vision in one eye:     Problems with dizziness:         Gastrointestinal    Blood in stool:  Vomited blood:         Genitourinary    Burning when urinating:     Blood in urine:        Psychiatric    Major depression:         Hematologic    Bleeding problems:    Problems with blood clotting too easily:        Skin    Rashes or ulcers:        Constitutional    Fever or chills:    -  PHYSICAL EXAM:   Vitals:   10/18/21 0836  BP: 138/85  Pulse: (!) 55  Resp: 18  Temp: 98.2 F (36.8 C)  TempSrc: Temporal  SpO2: 97%  Weight: 195 lb 4.8 oz (88.6 kg)  Height: 5' 10.5" (1.791 m)   Body mass index is 27.63 kg/m.  GENERAL: The patient is a well-nourished male, in no acute distress. The vital signs are documented above. CARDIAC: There is a regular rate and rhythm.  VASCULAR: I do not detect carotid bruits. He has palpable femoral, popliteal, and posterior tibial pulses bilaterally PULMONARY: There is good air exchange bilaterally without wheezing or rales. ABDOMEN: Soft and non-tender with normal pitched bowel sounds.  I do not palpate an aneurysm. MUSCULOSKELETAL: There  are no major deformities. NEUROLOGIC: No focal weakness or paresthesias are detected. SKIN: There are no ulcers or rashes noted. PSYCHIATRIC: The patient has a normal affect.  DATA:    DUPLEX ABDOMINAL AORTA: I have reviewed the duplex of the abdominal aorta that was done on 10/10/2021.  This shows that the maximum diameter of his infrarenal aorta is 3.2 cm.  LABS: I reviewed his labs from 10/17/2021.  His creatinine is 1.1.  GFR is greater than 60.  Total cholesterol 208.  LDL cholesterol 123.  Triglycerides 246.  Hemoglobin A1c was 5.2.  PSA was 2.34.  Waverly [facility]

## 2021-12-01 ENCOUNTER — Telehealth: Payer: Self-pay | Admitting: Family Medicine

## 2021-12-01 NOTE — Telephone Encounter (Signed)
Caller Name: Jayvier Burgher Call back phone #: [phone]  Reason for Call: Pt is complaining about a bill given to him for a TOC appt on 8/18. In the notes it clearly states that he was told he would not be receiving a cpe. I have explained to him that TOC is not preventative and he said that he would not be paying his bill. After explaining multiple times he got angrier and is requesting call from supervisor.

## 2021-12-04 NOTE — Telephone Encounter (Signed)
Message sent to coding team to review.

## 2021-12-06 NOTE — Telephone Encounter (Signed)
Per charge correction team this is being rebilled with [phone] Z00.00 (cpe/preventative).   LVM at [phone] to notify pt of above and to allow 30-45 days for resubmission to insurance.

## 2022-02-05 DIAGNOSIS — Z008 Encounter for other general examination: Secondary | ICD-10-CM | POA: Diagnosis not present

## 2022-02-05 DIAGNOSIS — Z87891 Personal history of nicotine dependence: Secondary | ICD-10-CM | POA: Diagnosis not present

## 2022-02-05 DIAGNOSIS — E785 Hyperlipidemia, unspecified: Secondary | ICD-10-CM | POA: Diagnosis not present

## 2022-02-05 DIAGNOSIS — R030 Elevated blood-pressure reading, without diagnosis of hypertension: Secondary | ICD-10-CM | POA: Diagnosis not present

## 2022-03-26 ENCOUNTER — Telehealth: Payer: Self-pay | Admitting: Family Medicine

## 2022-03-26 NOTE — Telephone Encounter (Signed)
Pt called disputing charges for CPE and additional OV 10/06/2021 $237.58 with Dr. Grandville Silos. Pt states conversation for HLD was brought up by provider. He said he was advised at check in not to discuss anything during CPE or there may be an extra charge.   Msg sent to coding team.

## 2022-07-05 ENCOUNTER — Telehealth: Payer: Self-pay | Admitting: Family Medicine

## 2022-07-05 NOTE — Telephone Encounter (Signed)
Left patient a detailed voice message to return call to office.   

## 2022-07-05 NOTE — Telephone Encounter (Signed)
Pt returned your call.  

## 2022-07-05 NOTE — Telephone Encounter (Signed)
Pt is requesting for orders to be placed for him to get his blood drawn. I offered an appt on 5/29, he declined. He is leaving for [place] for two months  at that time and wants this done before then. Please advise pt at [phone] [facility])

## 2022-07-06 ENCOUNTER — Telehealth: Payer: Self-pay | Admitting: Family Medicine

## 2022-07-06 NOTE — Telephone Encounter (Signed)
Spoke with patient and advised him that we would need to set an appointment with Dr. Thompson since we haven't seen him since 10/06/2021. I advised him that we wouldn't be able to order any labs without any orders that were previously placed for future labs. He verbalized understanding . I offered Dr. Thompson's availability and he declined all slots and stated he would call back when he comes back into the country.    

## 2022-07-06 NOTE — Telephone Encounter (Signed)
Spoke with patient and advised him that we would need to set an appointment with Dr. Janee Morn since we haven't seen him since 10/06/2021. I advised him that we wouldn't be able to order any labs without any orders that were previously placed for future labs. He verbalized understanding . I offered Dr. Carollee Massed availability and he declined all slots and stated he would call back when he comes back [place] the country.

## 2022-07-06 NOTE — Telephone Encounter (Signed)
Pt said he was supposed to make an appt for labs but I did not see an order. Please call

## 2022-11-06 ENCOUNTER — Ambulatory Visit: Payer: Medicare HMO | Admitting: Family Medicine

## 2022-11-06 ENCOUNTER — Encounter: Payer: Self-pay | Admitting: Family Medicine

## 2022-11-06 VITALS — BP 122/76 | HR 70 | Temp 97.8°F | Ht 70.5 in | Wt 194.4 lb

## 2022-11-06 DIAGNOSIS — I7143 Infrarenal abdominal aortic aneurysm, without rupture: Secondary | ICD-10-CM | POA: Diagnosis not present

## 2022-11-06 DIAGNOSIS — R351 Nocturia: Secondary | ICD-10-CM | POA: Insufficient documentation

## 2022-11-06 DIAGNOSIS — Z87828 Personal history of other (healed) physical injury and trauma: Secondary | ICD-10-CM | POA: Diagnosis not present

## 2022-11-06 DIAGNOSIS — Z23 Encounter for immunization: Secondary | ICD-10-CM

## 2022-11-06 DIAGNOSIS — R051 Acute cough: Secondary | ICD-10-CM

## 2022-11-06 DIAGNOSIS — E782 Mixed hyperlipidemia: Secondary | ICD-10-CM

## 2022-11-06 DIAGNOSIS — Z87891 Personal history of nicotine dependence: Secondary | ICD-10-CM | POA: Diagnosis not present

## 2022-11-06 DIAGNOSIS — E7800 Pure hypercholesterolemia, unspecified: Secondary | ICD-10-CM

## 2022-11-06 DIAGNOSIS — Z0000 Encounter for general adult medical examination without abnormal findings: Secondary | ICD-10-CM

## 2022-11-06 LAB — CBC WITH DIFFERENTIAL/PLATELET
Basophils Absolute: 0 10*3/uL (ref 0.0–0.1)
Basophils Relative: 0.8 % (ref 0.0–3.0)
Eosinophils Absolute: 0.2 10*3/uL (ref 0.0–0.7)
Eosinophils Relative: 3.6 % (ref 0.0–5.0)
HCT: 42.3 % (ref 39.0–52.0)
Hemoglobin: 14.3 g/dL (ref 13.0–17.0)
Lymphocytes Relative: 28.6 % (ref 12.0–46.0)
Lymphs Abs: 1.3 10*3/uL (ref 0.7–4.0)
MCHC: 33.8 g/dL (ref 30.0–36.0)
MCV: 89.8 fl (ref 78.0–100.0)
Monocytes Absolute: 0.3 10*3/uL (ref 0.1–1.0)
Monocytes Relative: 7.5 % (ref 3.0–12.0)
Neutro Abs: 2.6 10*3/uL (ref 1.4–7.7)
Neutrophils Relative %: 59.5 % (ref 43.0–77.0)
Platelets: 215 10*3/uL (ref 150.0–400.0)
RBC: 4.71 Mil/uL (ref 4.22–5.81)
RDW: 12.8 % (ref 11.5–15.5)
WBC: 4.4 10*3/uL (ref 4.0–10.5)

## 2022-11-06 LAB — MICROALBUMIN / CREATININE URINE RATIO
Creatinine,U: 158.4 mg/dL
Microalb Creat Ratio: 0.5 mg/g (ref 0.0–30.0)
Microalb, Ur: 0.7 mg/dL (ref 0.0–1.9)

## 2022-11-06 LAB — URINALYSIS, ROUTINE W REFLEX MICROSCOPIC
Bilirubin Urine: NEGATIVE
Hgb urine dipstick: NEGATIVE
Ketones, ur: NEGATIVE
Leukocytes,Ua: NEGATIVE
Nitrite: NEGATIVE
Specific Gravity, Urine: 1.015 (ref 1.000–1.030)
Total Protein, Urine: NEGATIVE
Urine Glucose: NEGATIVE
Urobilinogen, UA: 1 (ref 0.0–1.0)
pH: 7 (ref 5.0–8.0)

## 2022-11-06 LAB — COMPREHENSIVE METABOLIC PANEL
ALT: 19 U/L (ref 0–53)
AST: 19 U/L (ref 0–37)
Albumin: 4.1 g/dL (ref 3.5–5.2)
Alkaline Phosphatase: 41 U/L (ref 39–117)
BUN: 15 mg/dL (ref 6–23)
CO2: 28 meq/L (ref 19–32)
Calcium: 8.9 mg/dL (ref 8.4–10.5)
Chloride: 104 meq/L (ref 96–112)
Creatinine, Ser: 1 mg/dL (ref 0.40–1.50)
GFR: 78.19 mL/min (ref >60.00)
Glucose, Bld: 85 mg/dL (ref 70–99)
Potassium: 3.9 meq/L (ref 3.5–5.1)
Sodium: 140 meq/L (ref 135–145)
Total Bilirubin: 0.7 mg/dL (ref 0.2–1.2)
Total Protein: 7 g/dL (ref 6.0–8.3)

## 2022-11-06 LAB — LIPID PANEL
Cholesterol: 142 mg/dL (ref 0–200)
HDL: 39.6 mg/dL (ref >39.00)
LDL Cholesterol: 71 mg/dL (ref 0–99)
NonHDL: 102.43
Total CHOL/HDL Ratio: 4
Triglycerides: 158 mg/dL — ABNORMAL HIGH (ref 0.0–149.0)
VLDL: 31.6 mg/dL (ref 0.0–40.0)

## 2022-11-06 LAB — TSH: TSH: 2.11 u[IU]/mL (ref 0.35–5.50)

## 2022-11-06 LAB — HEMOGLOBIN A1C: Hgb A1c MFr Bld: 5 % (ref 4.6–6.5)

## 2022-11-06 LAB — PSA: PSA: 2.52 ng/mL (ref 0.10–4.00)
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
## 2022-11-06 NOTE — Assessment & Plan Note (Signed)
Symptomatic treatment as needed. monitor for any change. Follow-up if symptoms persist or worsen.

## 2022-11-06 NOTE — Assessment & Plan Note (Signed)
Continue rosuvastatin 10 mg daily. Recheck fasting lipid panel today.

## 2022-11-06 NOTE — Assessment & Plan Note (Signed)
Continue monitoring with vascular surgery as recommended. Maintain blood pressure <130/80 to prevent aneurysm expansion.

## 2022-11-09 LAB — VITAMIN D 1,25 DIHYDROXY
Vitamin D 1, 25 (OH)2 Total: 47 pg/mL (ref 18–72)
Vitamin D2 1, 25 (OH)2: <8 pg/mL
Vitamin D3 1, 25 (OH)2: 47 pg/mL

## 2022-11-12 MED ORDER — ROSUVASTATIN CALCIUM 20 MG PO TABS
20.0000 mg | ORAL_TABLET | Freq: Every day | ORAL | 3 refills | Status: DC
Start: 2022-11-12 — End: 2023-11-06

## 2022-11-12 NOTE — Addendum Note (Signed)
Addended by: Fanny Bien B on: 11/12/2022 11:38 AM   Modules accepted: Orders

## 2022-11-22 DIAGNOSIS — Z823 Family history of stroke: Secondary | ICD-10-CM | POA: Diagnosis not present

## 2022-11-22 DIAGNOSIS — Z87891 Personal history of nicotine dependence: Secondary | ICD-10-CM | POA: Diagnosis not present

## 2022-11-22 DIAGNOSIS — Z8249 Family history of ischemic heart disease and other diseases of the circulatory system: Secondary | ICD-10-CM | POA: Diagnosis not present

## 2022-11-22 DIAGNOSIS — Z809 Family history of malignant neoplasm, unspecified: Secondary | ICD-10-CM | POA: Diagnosis not present

## 2022-11-22 DIAGNOSIS — E785 Hyperlipidemia, unspecified: Secondary | ICD-10-CM | POA: Diagnosis not present

## 2023-01-10 DIAGNOSIS — K648 Other hemorrhoids: Secondary | ICD-10-CM | POA: Diagnosis not present

## 2023-01-10 DIAGNOSIS — Z1211 Encounter for screening for malignant neoplasm of colon: Secondary | ICD-10-CM | POA: Diagnosis not present

## 2023-01-10 DIAGNOSIS — D123 Benign neoplasm of transverse colon: Secondary | ICD-10-CM | POA: Diagnosis not present

## 2023-01-10 DIAGNOSIS — D122 Benign neoplasm of ascending colon: Secondary | ICD-10-CM | POA: Diagnosis not present

## 2023-01-14 DIAGNOSIS — D122 Benign neoplasm of ascending colon: Secondary | ICD-10-CM | POA: Diagnosis not present

## 2023-01-14 DIAGNOSIS — D123 Benign neoplasm of transverse colon: Secondary | ICD-10-CM | POA: Diagnosis not present

## 2023-02-18 ENCOUNTER — Ambulatory Visit: Payer: Medicare HMO

## 2023-03-11 ENCOUNTER — Telehealth: Payer: Self-pay | Admitting: Family Medicine

## 2023-03-27 NOTE — Telephone Encounter (Signed)
 Error

## 2023-05-03 DIAGNOSIS — Z860101 Personal history of adenomatous and serrated colon polyps: Secondary | ICD-10-CM | POA: Diagnosis not present

## 2023-05-03 DIAGNOSIS — K5900 Constipation, unspecified: Secondary | ICD-10-CM | POA: Diagnosis not present

## 2023-07-12 ENCOUNTER — Ambulatory Visit (INDEPENDENT_AMBULATORY_CARE_PROVIDER_SITE_OTHER): Payer: Medicare HMO

## 2023-07-12 ENCOUNTER — Ambulatory Visit: Payer: Self-pay | Admitting: Family Medicine

## 2023-07-12 VITALS — BP 102/60 | HR 55 | Temp 97.6°F | Ht 71.5 in | Wt 190.8 lb

## 2023-07-12 DIAGNOSIS — E782 Mixed hyperlipidemia: Secondary | ICD-10-CM

## 2023-07-12 DIAGNOSIS — Z0000 Encounter for general adult medical examination without abnormal findings: Secondary | ICD-10-CM

## 2023-07-12 LAB — HEPATIC FUNCTION PANEL
ALT: 16 U/L (ref 0–53)
AST: 18 U/L (ref 0–37)
Albumin: 4.6 g/dL (ref 3.5–5.2)
Alkaline Phosphatase: 49 U/L (ref 39–117)
Bilirubin, Direct: 0.1 mg/dL (ref 0.0–0.3)
Total Bilirubin: 0.7 mg/dL (ref 0.2–1.2)
Total Protein: 7.5 g/dL (ref 6.0–8.3)

## 2023-07-12 LAB — LIPID PANEL
Cholesterol: 154 mg/dL (ref 0–200)
HDL: 45.3 mg/dL (ref >39.00)
LDL Cholesterol: 93 mg/dL (ref 0–99)
NonHDL: 108.64
Total CHOL/HDL Ratio: 3
Triglycerides: 80 mg/dL (ref 0.0–149.0)
VLDL: 16 mg/dL (ref 0.0–40.0)

## 2023-07-12 NOTE — Progress Notes (Signed)
 Subjective:   Anthony Wolf is a 68 y.o. who presents for a Medicare Wellness preventive visit.  As a reminder, Annual Wellness Visits don't include a physical exam, and some assessments may be limited, especially if this visit is performed virtually. We may recommend an in-person follow-up visit with your provider if needed.  Visit Complete: In person    Persons Participating in Visit: Patient.  AWV Questionnaire: No: Patient Medicare AWV questionnaire was not completed prior to this visit.  Cardiac Risk Factors include: advanced age (>2men, >65 women);dyslipidemia;male gender     Objective:     Today's Vitals   07/12/23 0842  BP: 102/60  Pulse: (!) 55  Temp: 97.6 F (36.4 C)  TempSrc: Oral  SpO2: 98%  Weight: 190 lb 12.8 oz (86.5 kg)  Height: 5' 11.5" (1.816 m)   Body mass index is 26.24 kg/m.     07/12/2023    8:49 AM  Advanced Directives  Does Patient Have a Medical Advance Directive? No  Would patient like information on creating a medical advance directive? No - Patient declined    Current Medications (verified) Outpatient Encounter Medications as of 07/12/2023  Medication Sig   CALCIUM -MAGNESIUM-ZINC PO Take by mouth. Takes sometimes   Cholecalciferol (VITAMIN D -3 PO) Take by mouth. Takes sometimes   rosuvastatin  (CRESTOR ) 20 MG tablet Take 1 tablet (20 mg total) by mouth daily.   vitamin C (ASCORBIC ACID) 250 MG tablet Take 250 mg by mouth daily. Takes sometimes   No facility-administered encounter medications on file as of 07/12/2023.    Allergies (verified) Patient has no known allergies.   History: Past Medical History:  Diagnosis Date   Hyperlipidemia    Rotator cuff injury    Past Surgical History:  Procedure Laterality Date   colonscopy     08/11/2012-internal hemorrhoids, repeat in 2024   ROTATOR CUFF REPAIR     SHOULDER SURGERY Right 2010   Family History  Problem Relation Age of Onset   Cancer Mother    Stroke Father     Hypertension Father    Asthma Maternal Grandmother    Social History   Socioeconomic History   Marital status: Married    Spouse name: Not on file   Number of children: Not on file   Years of education: Not on file   Highest education level: Not on file  Occupational History   Not on file  Tobacco Use   Smoking status: Former    Current packs/day: 0.00    Average packs/day: 1 pack/day for 10.0 years (10.0 ttl pk-yrs)    Types: Cigarettes    Start date: 80    Quit date: 59    Years since quitting: 35.4    Passive exposure: Never   Smokeless tobacco: Never  Vaping Use   Vaping status: Never Used  Substance and Sexual Activity   Alcohol use: No   Drug use: No   Sexual activity: Not on file  Other Topics Concern   Not on file  Social History Narrative   Not on file   Social Drivers of Health   Financial Resource Strain: Low Risk  (07/12/2023)   Overall Financial Resource Strain (CARDIA)    Difficulty of Paying Living Expenses: Not hard at all  Food Insecurity: No Food Insecurity (07/12/2023)   Hunger Vital Sign    Worried About Running Out of Food in the Last Year: Never true    Ran Out of Food in the Last Year: Never true  Transportation Needs: No Transportation Needs (07/12/2023)   PRAPARE - Administrator, Civil Service (Medical): No    Lack of Transportation (Non-Medical): No  Physical Activity: Sufficiently Active (07/12/2023)   Exercise Vital Sign    Days of Exercise per Week: 5 days    Minutes of Exercise per Session: 50 min  Stress: No Stress Concern Present (07/12/2023)   [employer] of Occupational Health - Occupational Stress Questionnaire    Feeling of Stress : Not at all  Social Connections: Socially Integrated (07/12/2023)   Social Connection and Isolation Panel [NHANES]    Frequency of Communication with Friends and Family: More than three times a week    Frequency of Social Gatherings with Friends and Family: Three times a week     Attends Religious Services: More than 4 times per year    Active Member of Clubs or Organizations: Yes    Attends Banker Meetings: 1 to 4 times per year    Marital Status: Married    Tobacco Counseling Counseling given: Not Answered    Clinical Intake:  Pre-visit preparation completed: Yes  Pain : No/denies pain     Nutritional Risks: None Diabetes: No  Lab Results  Component Value Date   HGBA1C 5.0 11/06/2022   HGBA1C 5.2 10/17/2021     How often do you need to have someone help you when you read instructions, pamphlets, or other written materials from your doctor or pharmacy?: 1 - Never  Interpreter Needed?: No  Information entered by :: NAllen LPN   Activities of Daily Living     07/12/2023    8:44 AM  In your present state of health, do you have any difficulty performing the following activities:  Hearing? 0  Vision? 0  Difficulty concentrating or making decisions? 0  Walking or climbing stairs? 0  Dressing or bathing? 0  Doing errands, shopping? 0  Preparing Food and eating ? N  Using the Toilet? N  In the past six months, have you accidently leaked urine? N  Do you have problems with loss of bowel control? N  Managing your Medications? N  Managing your Finances? N  Housekeeping or managing your Housekeeping? N    Patient Care Team: Catheryn Cluck, MD as PCP - [facility], [facility]   Indicate any recent Medical Services you may have received from other than [facility] providers in the past year (date may be approximate).     Assessment:    This is a routine wellness examination for Anthony Wolf.  Hearing/Vision screen Hearing Screening - Comments:: Denies hearing issues Vision Screening - Comments:: Regular eye exams, [facility]   Goals Addressed             This Visit's Progress    Patient Stated       07/12/2023, working on weight and eating healthy and exercise       Depression  Screen     07/12/2023    8:51 AM 11/06/2022    9:02 AM 10/06/2021   10:43 AM 06/16/2019    8:23 AM 02/21/2018    9:44 AM 11/23/2016    8:13 AM 01/10/2016   11:26 AM  PHQ 2/9 Scores  PHQ - 2 Score 0 0 0 0 0 0 0  PHQ- 9 Score 0 0         Fall Risk     07/12/2023    8:50 AM 11/06/2022    9:02 AM 10/06/2021  10:14 AM 06/16/2019    8:23 AM 02/21/2018    9:44 AM  Fall Risk   Falls in the past year? 0 0 0 0 0  Number falls in past yr: 0 0 0 0   Injury with Fall? 0 0 0 0   Risk for fall due to : Medication side effect      Follow up Falls evaluation completed;Falls prevention discussed   Falls evaluation completed     MEDICARE RISK AT HOME:  Medicare Risk at Home Any stairs in or around the home?: Yes If so, are there any without handrails?: No Home free of loose throw rugs in walkways, pet beds, electrical cords, etc?: Yes Adequate lighting in your home to reduce risk of falls?: Yes Life alert?: No Use of a cane, walker or w/c?: No Grab bars in the bathroom?: No Shower chair or bench in shower?: No Elevated toilet seat or a handicapped toilet?: No  TIMED UP AND GO:  Was the test performed?  Yes  Length of time to ambulate 10 feet: 5 sec Gait steady and fast without use of assistive device  Cognitive Function: 6CIT completed        07/12/2023    8:52 AM  6CIT Screen  What Year? 0 points  What month? 0 points  What time? 0 points  Count back from 20 0 points  Months in reverse 0 points  Repeat phrase 4 points  Total Score 4 points    Immunizations Immunization History  Administered Date(s) Administered   Fluad Trivalent(High Dose 65+) 11/06/2022   MODERNA COVID-19 SARS-COV-2 PEDS BIVALENT BOOSTER 20yr-87yr 06/17/2019   Moderna Covid-19 Vaccine Bivalent Booster 51yrs & up 05/20/2019   Td 11/10/2014    Screening Tests Health Maintenance  Topic Date Due   COVID-19 Vaccine (3 - 2024-25 season) 10/21/2022   INFLUENZA VACCINE  09/20/2023   Medicare Annual Wellness  (AWV)  07/11/2024   DTaP/Tdap/Td (2 - Tdap) 11/09/2024   Colonoscopy  01/09/2026   Hepatitis C Screening  Completed   HPV VACCINES  Aged Out   Meningococcal B Vaccine  Aged Out   Pneumonia Vaccine 56+ Years old  Discontinued   Zoster Vaccines- Shingrix  Discontinued    Health Maintenance  Health Maintenance Due  Topic Date Due   COVID-19 Vaccine (3 - 2024-25 season) 10/21/2022   Health Maintenance Items Addressed: Due for covid vaccine  Additional Screening:  Vision Screening: Recommended annual ophthalmology exams for early detection of glaucoma and other disorders of the eye.  Dental Screening: Recommended annual dental exams for proper oral hygiene  Community Resource Referral / Chronic Care Management: CRR required this visit?  No   CCM required this visit?  No   Plan:    I have personally reviewed and noted the following in the patient's chart:   Medical and social history Use of alcohol, tobacco or illicit drugs  Current medications and supplements including opioid prescriptions. Patient is not currently taking opioid prescriptions. Functional ability and status Nutritional status Physical activity Advanced directives List of other physicians Hospitalizations, surgeries, and ER visits in previous 12 months Vitals Screenings to include cognitive, depression, and falls Referrals and appointments  In addition, I have reviewed and discussed with patient certain preventive protocols, quality metrics, and best practice recommendations. A written personalized care plan for preventive services as well as general preventive health recommendations were provided to patient.   Areatha Beecham, LPN   2/95/6213   After Visit Summary: (In Person-Printed) AVS  printed and given to the patient  Notes: Nothing significant to report at this time.

## 2023-07-12 NOTE — Patient Instructions (Addendum)
 [name] , Thank you for taking time out of your busy schedule to complete your Annual Wellness Visit with me. I enjoyed our conversation and look forward to speaking with you again next year. I, as well as your care team,  appreciate your ongoing commitment to your health goals. Please review the following plan we discussed and let me know if I can assist you in the future. Your Game plan/ To Do List    Referrals: If you haven't heard from the office you've been referred to, please reach out to them at the phone provided.  N/a Follow up Visits: Next Medicare AWV with our clinical staff: 07/13/2024 at 8:50   Have you seen your provider in the last 6 months (3 months if uncontrolled diabetes)? No Next Office Visit with your provider: 11/11/2023 at 9:00  Clinician Recommendations:  Aim for 30 minutes of exercise or brisk walking, 6-8 glasses of water, and 5 servings of fruits and vegetables each day.       This is a list of the screening recommended for you and due dates:  Health Maintenance  Topic Date Due   COVID-19 Vaccine (3 - 2024-25 season) 10/21/2022   Flu Shot  09/20/2023   Medicare Annual Wellness Visit  07/11/2024   DTaP/Tdap/Td vaccine (2 - Tdap) 11/09/2024   Colon Cancer Screening  01/09/2026   Hepatitis C Screening  Completed   HPV Vaccine  Aged Out   Meningitis B Vaccine  Aged Out   Pneumonia Vaccine  Discontinued   Zoster (Shingles) Vaccine  Discontinued    Advanced directives: (Declined) Advance directive discussed with you today. Even though you declined this today, please call our office should you change your mind, and we can give you the proper paperwork for you to fill out. Advance Care Planning is important because it:  [x]  Makes sure you receive the medical care that is consistent with your values, goals, and preferences  [x]  It provides guidance to your family and loved ones and reduces their decisional burden about whether or not they are making the right  decisions based on your wishes.  Follow the link provided in your after visit summary or read over the paperwork we have mailed to you to help you started getting your Advance Directives in place. If you need assistance in completing these, please reach out to us  so that we can help you!  See attachments for Preventive Care and Fall Prevention Tips.

## 2023-07-23 DIAGNOSIS — H1045 Other chronic allergic conjunctivitis: Secondary | ICD-10-CM | POA: Diagnosis not present

## 2023-07-23 DIAGNOSIS — Z135 Encounter for screening for eye and ear disorders: Secondary | ICD-10-CM | POA: Diagnosis not present

## 2023-07-23 DIAGNOSIS — H52223 Regular astigmatism, bilateral: Secondary | ICD-10-CM | POA: Diagnosis not present

## 2023-07-23 DIAGNOSIS — H5203 Hypermetropia, bilateral: Secondary | ICD-10-CM | POA: Diagnosis not present

## 2023-07-23 DIAGNOSIS — H524 Presbyopia: Secondary | ICD-10-CM | POA: Diagnosis not present

## 2023-08-14 ENCOUNTER — Encounter: Payer: Self-pay | Admitting: Family Medicine

## 2023-11-06 ENCOUNTER — Other Ambulatory Visit: Payer: Self-pay | Admitting: Family Medicine

## 2023-11-06 DIAGNOSIS — E782 Mixed hyperlipidemia: Secondary | ICD-10-CM

## 2023-11-11 ENCOUNTER — Encounter: Payer: Medicare HMO | Admitting: Family Medicine

## 2023-11-12 ENCOUNTER — Encounter: Payer: Self-pay | Admitting: Family Medicine

## 2023-11-12 ENCOUNTER — Ambulatory Visit: Admitting: Family Medicine

## 2023-11-12 VITALS — BP 127/83 | HR 57 | Temp 97.2°F | Resp 18 | Wt 195.2 lb

## 2023-11-12 DIAGNOSIS — Z23 Encounter for immunization: Secondary | ICD-10-CM

## 2023-11-12 DIAGNOSIS — E782 Mixed hyperlipidemia: Secondary | ICD-10-CM

## 2023-11-12 DIAGNOSIS — Z0000 Encounter for general adult medical examination without abnormal findings: Secondary | ICD-10-CM

## 2023-11-12 DIAGNOSIS — Z125 Encounter for screening for malignant neoplasm of prostate: Secondary | ICD-10-CM

## 2023-11-12 DIAGNOSIS — I7143 Infrarenal abdominal aortic aneurysm, without rupture: Secondary | ICD-10-CM | POA: Diagnosis not present

## 2023-11-12 LAB — PSA: PSA: 2.64 ng/mL (ref 0.10–4.00)

## 2023-11-12 LAB — CBC WITH DIFFERENTIAL/PLATELET
Basophils Absolute: 0 K/uL (ref 0.0–0.1)
Basophils Relative: 0.8 % (ref 0.0–3.0)
Eosinophils Absolute: 0.2 K/uL (ref 0.0–0.7)
Eosinophils Relative: 3.7 % (ref 0.0–5.0)
HCT: 42 % (ref 39.0–52.0)
Hemoglobin: 14.1 g/dL (ref 13.0–17.0)
Lymphocytes Relative: 26.6 % (ref 12.0–46.0)
Lymphs Abs: 1.2 K/uL (ref 0.7–4.0)
MCHC: 33.6 g/dL (ref 30.0–36.0)
MCV: 89.7 fl (ref 78.0–100.0)
Monocytes Absolute: 0.4 K/uL (ref 0.1–1.0)
Monocytes Relative: 9.5 % (ref 3.0–12.0)
Neutro Abs: 2.6 K/uL (ref 1.4–7.7)
Neutrophils Relative %: 59.4 % (ref 43.0–77.0)
Platelets: 212 K/uL (ref 150.0–400.0)
RBC: 4.68 Mil/uL (ref 4.22–5.81)
RDW: 13.2 % (ref 11.5–15.5)
WBC: 4.4 K/uL (ref 4.0–10.5)

## 2023-11-12 LAB — COMPREHENSIVE METABOLIC PANEL WITH GFR
ALT: 21 U/L (ref 0–53)
AST: 20 U/L (ref 0–37)
Albumin: 4.3 g/dL (ref 3.5–5.2)
Alkaline Phosphatase: 43 U/L (ref 39–117)
BUN: 17 mg/dL (ref 6–23)
CO2: 28 meq/L (ref 19–32)
Calcium: 9 mg/dL (ref 8.4–10.5)
Chloride: 104 meq/L (ref 96–112)
Creatinine, Ser: 1 mg/dL (ref 0.40–1.50)
GFR: 77.64 mL/min (ref >60.00)
Glucose, Bld: 82 mg/dL (ref 70–99)
Potassium: 3.8 meq/L (ref 3.5–5.1)
Sodium: 140 meq/L (ref 135–145)
Total Bilirubin: 0.7 mg/dL (ref 0.2–1.2)
Total Protein: 7 g/dL (ref 6.0–8.3)

## 2023-11-12 LAB — LIPID PANEL
Cholesterol: 152 mg/dL (ref 0–200)
HDL: 38.4 mg/dL — ABNORMAL LOW (ref >39.00)
LDL Cholesterol: 87 mg/dL (ref 0–99)
NonHDL: 113.25
Total CHOL/HDL Ratio: 4
Triglycerides: 131 mg/dL (ref 0.0–149.0)
VLDL: 26.2 mg/dL (ref 0.0–40.0)

## 2023-11-12 LAB — HEMOGLOBIN A1C: Hgb A1c MFr Bld: 5.5 % (ref 4.6–6.5)

## 2023-11-12 LAB — MICROALBUMIN / CREATININE URINE RATIO
Creatinine,U: 142.9 mg/dL
Microalb Creat Ratio: 5.4 mg/g (ref 0.0–30.0)
Microalb, Ur: 0.8 mg/dL (ref 0.0–1.9)

## 2023-11-12 MED ORDER — ROSUVASTATIN CALCIUM 10 MG PO TABS: 10.0000 mg | ORAL_TABLET | Freq: Every day | ORAL | 3 refills | Status: DC

## 2023-11-12 NOTE — Patient Instructions (Addendum)
                         Contains text generated by Abridge.                                 Contains text generated by Abridge.
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
## 2023-11-13 ENCOUNTER — Ambulatory Visit: Payer: Self-pay | Admitting: Family Medicine

## 2023-11-13 LAB — TSH RFX ON ABNORMAL TO FREE T4: TSH: 1.52 u[IU]/mL (ref 0.450–4.500)

## 2023-11-16 LAB — URINALYSIS W MICROSCOPIC + REFLEX CULTURE
Bacteria, UA: NONE SEEN /HPF
Bilirubin Urine: NEGATIVE
Glucose, UA: NEGATIVE
Hgb urine dipstick: NEGATIVE
Hyaline Cast: NONE SEEN /LPF
Ketones, ur: NEGATIVE
Leukocyte Esterase: NEGATIVE
Nitrites, Initial: NEGATIVE
Protein, ur: NEGATIVE
RBC / HPF: NONE SEEN /HPF (ref 0–2)
Specific Gravity, Urine: 1.02 (ref 1.001–1.035)
Squamous Epithelial / HPF: NONE SEEN /HPF (ref <=5)
WBC, UA: NONE SEEN /HPF (ref 0–5)
pH: 6.5 (ref 5.0–8.0)

## 2023-11-16 LAB — VITAMIN D 1,25 DIHYDROXY
Vitamin D 1, 25 (OH)2 Total: 43 pg/mL (ref 18–72)
Vitamin D2 1, 25 (OH)2: <8 pg/mL
Vitamin D3 1, 25 (OH)2: 43 pg/mL

## 2023-11-16 LAB — NO CULTURE INDICATED

## 2024-02-11 ENCOUNTER — Ambulatory Visit: Admitting: Family Medicine

## 2024-02-24 ENCOUNTER — Ambulatory Visit (INDEPENDENT_AMBULATORY_CARE_PROVIDER_SITE_OTHER): Admitting: Family Medicine

## 2024-02-24 VITALS — BP 118/79 | HR 57 | Temp 97.7°F | Ht 71.5 in | Wt 196.6 lb

## 2024-02-24 DIAGNOSIS — I7143 Infrarenal abdominal aortic aneurysm, without rupture: Secondary | ICD-10-CM | POA: Diagnosis not present

## 2024-02-24 DIAGNOSIS — E782 Mixed hyperlipidemia: Secondary | ICD-10-CM

## 2024-02-24 DIAGNOSIS — R42 Dizziness and giddiness: Secondary | ICD-10-CM | POA: Diagnosis not present

## 2024-02-24 LAB — COMPREHENSIVE METABOLIC PANEL WITH GFR
ALT: 30 U/L (ref 3–53)
AST: 25 U/L (ref 5–37)
Albumin: 4.4 g/dL (ref 3.5–5.2)
Alkaline Phosphatase: 43 U/L (ref 39–117)
BUN: 20 mg/dL (ref 6–23)
CO2: 30 meq/L (ref 19–32)
Calcium: 9.3 mg/dL (ref 8.4–10.5)
Chloride: 103 meq/L (ref 96–112)
Creatinine, Ser: 1.02 mg/dL (ref 0.40–1.50)
GFR: 75.66 mL/min
Glucose, Bld: 89 mg/dL (ref 70–99)
Potassium: 4 meq/L (ref 3.5–5.1)
Sodium: 139 meq/L (ref 135–145)
Total Bilirubin: 0.6 mg/dL (ref 0.2–1.2)
Total Protein: 7.6 g/dL (ref 6.0–8.3)

## 2024-02-24 LAB — CBC WITH DIFFERENTIAL/PLATELET
Basophils Absolute: 0 K/uL (ref 0.0–0.1)
Basophils Relative: 0.7 % (ref 0.0–3.0)
Eosinophils Absolute: 0.2 K/uL (ref 0.0–0.7)
Eosinophils Relative: 3.9 % (ref 0.0–5.0)
HCT: 41.9 % (ref 39.0–52.0)
Hemoglobin: 14.5 g/dL (ref 13.0–17.0)
Lymphocytes Relative: 28.6 % (ref 12.0–46.0)
Lymphs Abs: 1.3 K/uL (ref 0.7–4.0)
MCHC: 34.6 g/dL (ref 30.0–36.0)
MCV: 89.3 fl (ref 78.0–100.0)
Monocytes Absolute: 0.4 K/uL (ref 0.1–1.0)
Monocytes Relative: 8.6 % (ref 3.0–12.0)
Neutro Abs: 2.6 K/uL (ref 1.4–7.7)
Neutrophils Relative %: 58.2 % (ref 43.0–77.0)
Platelets: 195 K/uL (ref 150.0–400.0)
RBC: 4.7 Mil/uL (ref 4.22–5.81)
RDW: 13.2 % (ref 11.5–15.5)
WBC: 4.5 K/uL (ref 4.0–10.5)

## 2024-02-24 LAB — LIPID PANEL
Cholesterol: 148 mg/dL (ref 28–200)
HDL: 41.3 mg/dL
LDL Cholesterol: 67 mg/dL (ref 10–99)
NonHDL: 106.66
Total CHOL/HDL Ratio: 4
Triglycerides: 196 mg/dL — ABNORMAL HIGH (ref 10.0–149.0)
VLDL: 39.2 mg/dL (ref 0.0–40.0)

## 2024-02-24 LAB — B12 AND FOLATE PANEL
Folate: 13.2 ng/mL
Vitamin B-12: 279 pg/mL (ref 211–911)
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
## 2024-02-25 LAB — TSH RFX ON ABNORMAL TO FREE T4: TSH: 2.57 u[IU]/mL (ref 0.450–4.500)

## 2024-02-27 ENCOUNTER — Ambulatory Visit: Payer: Self-pay | Admitting: Family Medicine

## 2024-02-27 DIAGNOSIS — E782 Mixed hyperlipidemia: Secondary | ICD-10-CM

## 2024-02-27 LAB — URINALYSIS W MICROSCOPIC + REFLEX CULTURE
Bacteria, UA: NONE SEEN /HPF
Bilirubin Urine: NEGATIVE
Glucose, UA: NEGATIVE
Hgb urine dipstick: NEGATIVE
Hyaline Cast: NONE SEEN /LPF
Ketones, ur: NEGATIVE
Leukocyte Esterase: NEGATIVE
Nitrites, Initial: NEGATIVE
Protein, ur: NEGATIVE
RBC / HPF: NONE SEEN /HPF (ref 0–2)
Specific Gravity, Urine: 1.022 (ref 1.001–1.035)
Squamous Epithelial / HPF: NONE SEEN /HPF
WBC, UA: NONE SEEN /HPF (ref 0–5)
pH: 7 (ref 5.0–8.0)

## 2024-02-27 LAB — IRON,TIBC AND FERRITIN PANEL
%SAT: 41 % (ref 20–48)
Ferritin: 98 ng/mL (ref 24–380)
Iron: 127 ug/dL (ref 50–180)
TIBC: 308 ug/dL (ref 250–425)

## 2024-02-27 LAB — NO CULTURE INDICATED

## 2024-02-27 LAB — VITAMIN D 1,25 DIHYDROXY
Vitamin D 1, 25 (OH)2 Total: 40 pg/mL (ref 18–72)
Vitamin D2 1, 25 (OH)2: <8 pg/mL
Vitamin D3 1, 25 (OH)2: 40 pg/mL

## 2024-02-27 MED ORDER — ROSUVASTATIN CALCIUM 20 MG PO TABS: 20.0000 mg | ORAL_TABLET | Freq: Every day | ORAL | 3 refills | Status: AC

## 2024-07-13 ENCOUNTER — Ambulatory Visit

## 2024-11-12 ENCOUNTER — Encounter: Admitting: Family Medicine
